# Patient Record
Sex: Male | Born: 1990 | Race: White | Hispanic: No | Marital: Married | Smoking: Former smoker
Health system: Southern US, Community
[De-identification: ages and names within clinical notes are randomized; demographics above are authoritative.]

## PROBLEM LIST (undated history)

## (undated) DIAGNOSIS — U071 COVID-19: Secondary | ICD-10-CM

## (undated) HISTORY — PX: NO PAST SURGERIES: SHX2092

## (undated) HISTORY — DX: COVID-19: U07.1

---
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
## 2015-10-17 ENCOUNTER — Other Ambulatory Visit: Payer: Self-pay | Admitting: Family Medicine

## 2015-10-18 LAB — CMP12+LP+TP+TSH+6AC+CBC/D/PLT
A/G RATIO: 1.7 (ref 1.1–2.5)
ALT: 99 IU/L — ABNORMAL HIGH (ref 0–44)
AST: 71 IU/L — ABNORMAL HIGH (ref 0–40)
Albumin: 4.3 g/dL (ref 3.5–5.5)
Alkaline Phosphatase: 104 IU/L (ref 39–117)
BUN / CREAT RATIO: 15 (ref 8–19)
BUN: 17 mg/dL (ref 6–20)
Basophils Absolute: 0 10*3/uL (ref 0.0–0.2)
Basos: 0 %
Bilirubin Total: 0.4 mg/dL (ref 0.0–1.2)
CHLORIDE: 104 mmol/L (ref 96–106)
CHOL/HDL RATIO: 7.1 ratio — AB (ref 0.0–5.0)
CREATININE: 1.1 mg/dL (ref 0.76–1.27)
Calcium: 9.4 mg/dL (ref 8.7–10.2)
Cholesterol, Total: 241 mg/dL — ABNORMAL HIGH (ref 100–199)
EOS (ABSOLUTE): 0.1 10*3/uL (ref 0.0–0.4)
EOS: 2 %
Estimated CHD Risk: 1.5 times avg. — ABNORMAL HIGH (ref 0.0–1.0)
Free Thyroxine Index: 1.8 (ref 1.2–4.9)
GFR, EST AFRICAN AMERICAN: 107 mL/min/{1.73_m2} (ref >59)
GFR, EST NON AFRICAN AMERICAN: 93 mL/min/{1.73_m2} (ref >59)
GGT: 77 IU/L — AB (ref 0–65)
GLUCOSE: 92 mg/dL (ref 65–99)
Globulin, Total: 2.5 g/dL (ref 1.5–4.5)
HDL: 34 mg/dL — AB (ref >39)
HEMOGLOBIN: 15.5 g/dL (ref 12.6–17.7)
Hematocrit: 45.4 % (ref 37.5–51.0)
Immature Grans (Abs): 0 10*3/uL (ref 0.0–0.1)
Immature Granulocytes: 1 %
Iron: 118 ug/dL (ref 38–169)
LDH: 237 IU/L — AB (ref 121–224)
LDL Calculated: 150 mg/dL — ABNORMAL HIGH (ref 0–99)
Lymphocytes Absolute: 2.1 10*3/uL (ref 0.7–3.1)
Lymphs: 35 %
MCH: 31.4 pg (ref 26.6–33.0)
MCHC: 34.1 g/dL (ref 31.5–35.7)
MCV: 92 fL (ref 79–97)
MONOCYTES: 8 %
Monocytes Absolute: 0.5 10*3/uL (ref 0.1–0.9)
NEUTROS ABS: 3.2 10*3/uL (ref 1.4–7.0)
Neutrophils: 54 %
PHOSPHORUS: 3.1 mg/dL (ref 2.5–4.5)
POTASSIUM: 4.1 mmol/L (ref 3.5–5.2)
Platelets: 260 10*3/uL (ref 150–379)
RBC: 4.94 x10E6/uL (ref 4.14–5.80)
RDW: 13.7 % (ref 12.3–15.4)
Sodium: 144 mmol/L (ref 134–144)
T3 Uptake Ratio: 32 % (ref 24–39)
T4, Total: 5.7 ug/dL (ref 4.5–12.0)
TSH: 1.8 u[IU]/mL (ref 0.450–4.500)
Total Protein: 6.8 g/dL (ref 6.0–8.5)
Triglycerides: 284 mg/dL — ABNORMAL HIGH (ref 0–149)
URIC ACID: 7 mg/dL (ref 3.7–8.6)
VLDL CHOLESTEROL CAL: 57 mg/dL — AB (ref 5–40)
WBC: 5.9 10*3/uL (ref 3.4–10.8)

## 2015-10-18 LAB — HEPATITIS B SURFACE ANTIBODY,QUALITATIVE: HEP B SURFACE AB, QUAL: REACTIVE

## 2016-09-19 ENCOUNTER — Emergency Department
Admission: EM | Admit: 2016-09-19 | Discharge: 2016-09-20 | Disposition: A | Discharge status: Home / Self Care | Payer: Worker's Compensation | Attending: Student in an Organized Health Care Education/Training Program | Admitting: Student in an Organized Health Care Education/Training Program

## 2016-09-19 ENCOUNTER — Encounter: Payer: Self-pay | Admitting: Emergency Medicine

## 2016-09-19 DIAGNOSIS — S81812A Laceration without foreign body, left lower leg, initial encounter: Secondary | ICD-10-CM

## 2016-09-19 DIAGNOSIS — W268XXA Contact with other sharp object(s), not elsewhere classified, initial encounter: Secondary | ICD-10-CM | POA: Insufficient documentation

## 2016-09-19 DIAGNOSIS — S81822A Laceration with foreign body, left lower leg, initial encounter: Secondary | ICD-10-CM | POA: Diagnosis not present

## 2016-09-19 DIAGNOSIS — Y939 Activity, unspecified: Secondary | ICD-10-CM | POA: Insufficient documentation

## 2016-09-19 DIAGNOSIS — Y929 Unspecified place or not applicable: Secondary | ICD-10-CM | POA: Diagnosis not present

## 2016-09-19 DIAGNOSIS — Y999 Unspecified external cause status: Secondary | ICD-10-CM | POA: Insufficient documentation

## 2016-09-19 DIAGNOSIS — Z23 Encounter for immunization: Secondary | ICD-10-CM | POA: Insufficient documentation

## 2016-09-19 DIAGNOSIS — S8992XA Unspecified injury of left lower leg, initial encounter: Secondary | ICD-10-CM | POA: Diagnosis present

## 2016-09-19 NOTE — ED Notes (Signed)
approx 5" lac noted to left knee with no active bleeding; pt becomes pale & diaphoretic; pt placed on stretcher and taken to room 12; charge nurse notified
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
## 2016-09-20 ENCOUNTER — Emergency Department: Payer: Worker's Compensation

## 2016-09-20 MED ORDER — CEPHALEXIN 500 MG PO CAPS
500.0000 mg | ORAL_CAPSULE | Freq: Once | ORAL | Status: AC
  Administered 2016-09-20: 500 mg via ORAL
  Filled 2016-09-20: qty 1

## 2016-09-20 MED ORDER — BACITRACIN ZINC 500 UNIT/GM EX OINT
TOPICAL_OINTMENT | Freq: Once | CUTANEOUS | Status: AC
  Administered 2016-09-20: 1 via TOPICAL
  Filled 2016-09-20 (×2): qty 0.9

## 2016-09-20 MED ORDER — TRAMADOL HCL 50 MG PO TABS: 50.0000 mg | ORAL_TABLET | Freq: Four times a day (QID) | ORAL | 0 refills | Status: AC | PRN

## 2016-09-20 MED ORDER — BUPIVACAINE HCL (PF) 0.25 % IJ SOLN
INTRAMUSCULAR | Status: AC
  Filled 2016-09-20: qty 30

## 2016-09-20 MED ORDER — LIDOCAINE-EPINEPHRINE-TETRACAINE (LET) SOLUTION
3.0000 mL | Freq: Once | NASAL | Status: AC
  Administered 2016-09-20: 3 mL via TOPICAL
  Filled 2016-09-20: qty 3

## 2016-09-20 MED ORDER — BUPIVACAINE HCL 0.25 % IJ SOLN: 30.0000 mL | Freq: Once | INTRAMUSCULAR | Status: DC

## 2016-09-20 MED ORDER — CEPHALEXIN 500 MG PO CAPS: 500.0000 mg | ORAL_CAPSULE | Freq: Three times a day (TID) | ORAL | 0 refills | Status: DC

## 2016-09-20 MED ORDER — BUPIVACAINE HCL (PF) 0.25 % IJ SOLN
30.0000 mL | Freq: Once | INTRAMUSCULAR | Status: AC
  Administered 2016-09-20: 30 mL

## 2016-09-20 MED ORDER — TETANUS-DIPHTH-ACELL PERTUSSIS 5-2.5-18.5 LF-MCG/0.5 IM SUSP
0.5000 mL | Freq: Once | INTRAMUSCULAR | Status: AC
  Administered 2016-09-20: 0.5 mL via INTRAMUSCULAR
  Filled 2016-09-20: qty 0.5

## 2016-09-20 NOTE — Discharge Instructions (Signed)
Tylenol or Motrin as needed for any discomfort. Take any/all prescribed medications as directed. Keep the cut clean & dry for the next 24 hours. After that you may wash it gently once a day with warm, soapy water only. NO peroxide or alcohol!! You may apply some antibiotic ointment & a Band-Aid afterwards.
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
## 2017-03-28 ENCOUNTER — Encounter: Payer: Self-pay | Admitting: Physician Assistant

## 2017-03-28 ENCOUNTER — Ambulatory Visit: Payer: Self-pay | Admitting: Physician Assistant

## 2017-03-28 VITALS — BP 120/80 | HR 76 | Temp 98.3°F

## 2017-03-28 DIAGNOSIS — Z0000 Encounter for general adult medical examination without abnormal findings: Secondary | ICD-10-CM

## 2017-03-28 NOTE — Progress Notes (Signed)
S: pt here for wellness physical had biometrics for insurance purposes done at work, no complaints ros neg. PMH: neg   Social: former smoker quit 2 months ago, drinks about 6 beers a week  Fam:neg  O: vitals wnl, nad, ENT wnl, neck supple no lymph, lungs c t a, cv rrr, abd soft nontender bs normal all 4 quads  A: wellness physical  P: f/u prn

## 2019-05-04 ENCOUNTER — Encounter: Payer: Self-pay | Admitting: Adult Health

## 2019-05-04 ENCOUNTER — Other Ambulatory Visit: Payer: Self-pay

## 2019-05-04 ENCOUNTER — Ambulatory Visit: Payer: Managed Care, Other (non HMO) | Admitting: Adult Health

## 2019-05-04 DIAGNOSIS — Z20828 Contact with and (suspected) exposure to other viral communicable diseases: Secondary | ICD-10-CM

## 2019-05-04 DIAGNOSIS — Z7189 Other specified counseling: Secondary | ICD-10-CM | POA: Diagnosis not present

## 2019-05-04 DIAGNOSIS — Z20822 Contact with and (suspected) exposure to covid-19: Secondary | ICD-10-CM

## 2019-05-04 NOTE — Progress Notes (Signed)
Virtual Visit via Telephone Note  I connected with Nicholas Barber on 05/04/19 at  3:30 PM EDT by telephone and verified that I am speaking with the correct person using two identifiers.  Location: Patient: at home   [facility], [place], [place]     I discussed the limitations, risks, security and privacy concerns of performing an evaluation and management service by telephone and the availability of in person appointments. I also discussed with the patient that there may be a patient responsible charge related to this service. The patient expressed understanding and agreed to proceed.   History of Present Illness: Patient is a 28 year old male in no acute distress who calls the clinic for a telephone visit for Covid exposure at work on July 1st Wednesday.   He does now have cough, nasal congestion, sore throat. Temp on 05/03/19 100.2 that has resolved. Decreased smell. Decreased taste.   Symptoms onset  05/03/19. Patient  denies any chills, rash, chest pain, shortness of breath, nausea, vomiting, or diarrhea.   Denies any one sick at home. He denies being immunocompromised.     Light Smoker Types: Cigarettes Packs/day: 0 Years: 2 Quit Date: 01/26/2017 Question: Are you ready to quit smoking? Response: Yes Comments:      Observations/Objective: afebrile. No other vitals.    Patient is alert and oriented and responsive to questions Engages in conversation with provider. Speaks in full sentences without any pauses without any shortness of breath or distress.    Assessment and Plan: 1. Close Exposure to Covid-19 Virus   2. Educated About Covid-19 Virus Infection      Follow Up Instructions:    I discussed the assessment and treatment plan with the patient. The patient was provided an opportunity to ask questions and all were answered. The patient agreed with the plan and demonstrated an understanding of the instructions.   The  patient was advised to call back or seek an in-person evaluation if the symptoms worsen or if the condition fails to improve as anticipated.  I provided 15 minutes of non-face-to-face time during this encounter.   Marcille Buffy, FNP

## 2019-05-05 ENCOUNTER — Encounter: Payer: Self-pay | Admitting: Adult Health

## 2019-05-05 ENCOUNTER — Telehealth: Payer: Self-pay | Admitting: *Deleted

## 2019-05-05 DIAGNOSIS — Z20822 Contact with and (suspected) exposure to covid-19: Secondary | ICD-10-CM

## 2019-05-05 NOTE — Telephone Encounter (Signed)
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 Needs 2 negative covid tests 24 hours apart to return to work. Appt scheduled and Instructions given. Orders placed

## 2019-05-05 NOTE — Telephone Encounter (Signed)
-----   Message from Doreen Beam, [place] sent at 05/05/2019  3:35 PM EDT ----- Needs testing on 7/7 or 7/8 and then repeat test 24 hours later  covid exposure.

## 2019-05-06 ENCOUNTER — Telehealth: Payer: Self-pay | Admitting: Adult Health

## 2019-05-06 ENCOUNTER — Other Ambulatory Visit: Payer: Self-pay

## 2019-05-06 ENCOUNTER — Other Ambulatory Visit: Payer: Self-pay | Admitting: Adult Health

## 2019-05-06 DIAGNOSIS — Z20822 Contact with and (suspected) exposure to covid-19: Secondary | ICD-10-CM

## 2019-05-06 MED ORDER — BENZONATATE 200 MG PO CAPS: 200.0000 mg | ORAL_CAPSULE | Freq: Three times a day (TID) | ORAL | 0 refills | Status: DC | PRN

## 2019-05-06 MED ORDER — CLARITHROMYCIN 500 MG PO TABS: 500.0000 mg | ORAL_TABLET | Freq: Two times a day (BID) | ORAL | 0 refills | Status: DC

## 2019-05-06 NOTE — Telephone Encounter (Addendum)
Patient called after worker compensation nurse sent temperature of  101.1 this morning.  He reports he took Dayquil once yesterday and once  today and temperature at this moment is 98.3. Chest congestion now developed mostly clear/with yellow tinge occasionally.Denies pain with breathing.  Feels slightly  better today than yesterday.  No pain with breathing.  Moderate cough. No shortness of breath. No gastrointestinal symptoms. No Rash.   Patient  denies any chills, rash, chest pain, shortness of breath, nausea, vomiting, or diarrhea.   Patient is alert and oriented and responsive to questions Engages in conversation with provider. Speaks in full sentences without any pauses without any shortness of breath or distress.   No Known Allergies   Meds ordered this encounter  Medications  . clarithromycin (BIAXIN) 500 MG tablet    Sig: Take 1 tablet (500 mg total) by mouth 2 (two) times daily.    Dispense:  20 tablet    Refill:  0  . benzonatate (TESSALON) 200 MG capsule    Sig: Take 1 capsule (200 mg total) by mouth 3 (three) times daily as needed for cough (will cause drowsiness).    Dispense:  20 capsule    Refill:  0   Discussed medications as above and use, RED Flags discussed with patient and when to seek emergency care/ Call 911, immediately.

## 2019-05-07 ENCOUNTER — Other Ambulatory Visit: Payer: Self-pay

## 2019-05-08 ENCOUNTER — Emergency Department
Admission: EM | Admit: 2019-05-08 | Discharge: 2019-05-08 | Disposition: A | Discharge status: Home / Self Care | Payer: Managed Care, Other (non HMO) | Attending: Emergency Medicine | Admitting: Emergency Medicine

## 2019-05-08 ENCOUNTER — Emergency Department: Payer: Managed Care, Other (non HMO)

## 2019-05-08 ENCOUNTER — Encounter: Payer: Self-pay | Admitting: Emergency Medicine

## 2019-05-08 ENCOUNTER — Telehealth: Payer: Self-pay | Admitting: Adult Health

## 2019-05-08 ENCOUNTER — Other Ambulatory Visit: Payer: Self-pay

## 2019-05-08 DIAGNOSIS — Z79899 Other long term (current) drug therapy: Secondary | ICD-10-CM | POA: Insufficient documentation

## 2019-05-08 DIAGNOSIS — U071 COVID-19: Secondary | ICD-10-CM | POA: Insufficient documentation

## 2019-05-08 DIAGNOSIS — Z87891 Personal history of nicotine dependence: Secondary | ICD-10-CM | POA: Diagnosis not present

## 2019-05-08 DIAGNOSIS — R05 Cough: Secondary | ICD-10-CM | POA: Diagnosis present

## 2019-05-08 LAB — SARS CORONAVIRUS 2 BY RT PCR (HOSPITAL ORDER, PERFORMED IN ~~LOC~~ HOSPITAL LAB): SARS Coronavirus 2: POSITIVE — AB

## 2019-05-08 MED ORDER — IBUPROFEN 600 MG PO TABS: 600.0000 mg | ORAL_TABLET | Freq: Three times a day (TID) | ORAL | 0 refills | Status: DC | PRN

## 2019-05-08 MED ORDER — HYDROCOD POLST-CPM POLST ER 10-8 MG/5ML PO SUER: 5.0000 mL | Freq: Two times a day (BID) | ORAL | 0 refills | Status: DC

## 2019-05-08 NOTE — ED Notes (Signed)
See triage note  States he has had 2 COVID test done but was sent in by nurse at work  States he is having a cough with some wheezing  Low grade fever on arrival

## 2019-05-08 NOTE — Telephone Encounter (Signed)
Patient called he is having increasing shortness of breath and hearing " crackling sounds in his lungs when he breaths".  He reports his cough is worsening since yesterday, " it hurts to take a deep breath " and " I feel like I can not catch my breath at times". He is still febrile 100.8  He has been on Clarithromycin and Benzonatate since 05/06/19, Denies any improvement in cough with cough medication as above.  He is able to speak in full sentences but does have harsh loud coughing while on the phone multiple time.  Advised emergency room now for stat WBC and Chest x ray further work up that is not available in this clinic. 911 if any symptoms worsen. Not to self drive.   Follow up as needed after evaluation at emergency room and Covid test is still pending was performed on 05/05/19 at drive through testing.   Patient verbalized understanding of all instructions given and denies any further questions at this time.

## 2019-05-08 NOTE — ED Provider Notes (Signed)
[facility] Emergency Department Provider Note   ____________________________________________   First MD Initiated Contact with Patient 05/08/19 1159     (approximate)  I have reviewed the triage vital signs and the nursing notes.   HISTORY  Chief Complaint Cough (+ COVID exposure)    HPI Nicholas Barber is a 28 y.o. male patient presents with cough for 2 to 3 days.  Patient also has intermittent fever, body aches, loss of taste and smell.  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  Patient state max temperature at home was 103 1 hour prior to arrival.  Patient took ibuprofen prior to arrival.         History reviewed. No pertinent past medical history.  There are no active problems to display for this patient.   History reviewed. No pertinent surgical history.  Prior to Admission medications   Medication Sig Start Date End Date Taking? Authorizing Provider  benzonatate (TESSALON) 200 MG capsule Take 1 capsule (200 mg total) by mouth 3 (three) times daily as needed for cough (will cause drowsiness). 05/06/19   Flinchum, Eula FriedMichelle S, FNP  chlorpheniramine-HYDROcodone (TUSSIONEX PENNKINETIC ER) 10-8 MG/5ML SUER Take 5 mLs by mouth 2 (two) times daily. 05/08/19   Joni ReiningSmith, Charlita Brian K, PA-C  clarithromycin (BIAXIN) 500 MG tablet Take 1 tablet (500 mg total) by mouth 2 (two) times daily. 05/06/19   Flinchum, Eula FriedMichelle S, FNP  ibuprofen (ADVIL) 600 MG tablet Take 1 tablet (600 mg total) by mouth every 8 (eight) hours as needed. 05/08/19   Joni ReiningSmith, Lenus Trauger K, PA-C    Allergies Patient has no known allergies.  Family History  Problem Relation Age of Onset  . Early death Neg Hx   . Heart disease Neg Hx   . Cancer Neg Hx     Social History Social History   Tobacco Use  . Smoking status: Former Smoker    Quit date: 01/26/2017    Years since quitting: 2.2  . Smokeless tobacco: Never Used  Substance Use Topics   . Alcohol use: Yes    Alcohol/week: 6.0 standard drinks    Types: 6 Cans of beer per week  . Drug use: No    Review of Systems Constitutional: No fever/chills.  Lays Eyes: No visual changes. ENT: No sore throat. Cardiovascular: Denies chest pain. Respiratory: Denies shortness of breath.  Dyspnea secondary to nonproductive cough. Gastrointestinal: No abdominal pain.  No nausea, no vomiting.  No diarrhea.  No constipation. Genitourinary: Negative for dysuria. Musculoskeletal: Negative for back pain. Skin: Negative for rash. Neurological: Positive for headaches, but denies focal weakness or numbness.  ____________________________________________   PHYSICAL EXAM:  VITAL SIGNS: ED Triage Vitals  Enc Vitals Group     BP 05/08/19 1116 115/69     Pulse Rate 05/08/19 1116 96     Resp 05/08/19 1116 18     Temp 05/08/19 1116 99.7 F (37.6 C)     Temp Source 05/08/19 1116 Oral     SpO2 05/08/19 1116 95 %     Weight 05/08/19 1119 170 lb (77.1 kg)     Height 05/08/19 1119 5\' 6"  (1.676 m)     Head Circumference --      Peak Flow --      Pain Score 05/08/19 1122 0     Pain Loc --      Pain Edu? --      Excl. in GC? --    Constitutional: Alert and  oriented. Well appearing and in no acute distress. Hematological/Lymphatic/Immunilogical: No cervical lymphadenopathy. Cardiovascular: Normal rate, regular rhythm. Grossly normal heart sounds.  Good peripheral circulation. Respiratory: Normal respiratory effort.  No retractions. Lungs CTAB. Gastrointestinal: Soft and nontender. No distention. No abdominal bruits. No CVA tenderness. Genitourinary: Deferred Musculoskeletal: No lower extremity tenderness nor edema.  No joint effusions. Neurologic:  Normal speech and language. No gross focal neurologic deficits are appreciated. No gait instability. Skin:  Skin is warm, dry and intact. No rash noted. Psychiatric: Mood and affect are normal. Speech and behavior are normal.   ____________________________________________   LABS (all labs ordered are listed, but only abnormal results are displayed)  Labs Reviewed  SARS CORONAVIRUS 2 (HOSPITAL ORDER, PERFORMED IN [facility]) - Abnormal; Notable for the following components:      Result Value   SARS Coronavirus 2 POSITIVE (*)    All other components within normal limits   ____________________________________________  EKG   ____________________________________________  RADIOLOGY  ED MD interpretation:   Official radiology report(s): Dg Chest Portable 1 View  Result Date: 05/08/2019 CLINICAL DATA:  Cough, fever. EXAM: PORTABLE CHEST 1 VIEW COMPARISON:  None. FINDINGS: The heart size and mediastinal contours are within normal limits. No pneumothorax or pleural effusion is noted. Minimal to mild bibasilar opacities are noted which may represent atelectasis or possibly infiltrates. The visualized skeletal structures are unremarkable. IMPRESSION: Minimal to mild bibasilar opacities are noted which may represent atelectasis or possibly infiltrates. Electronically Signed   By: Lupita RaiderJames  Green Jr M.D.   On: 05/08/2019 13:19    ____________________________________________   PROCEDURES  Procedure(s) performed (including Critical Care):  Procedures   ____________________________________________   INITIAL IMPRESSION / ASSESSMENT AND PLAN / ED COURSE  As part of my medical decision making, I reviewed the following data within the electronic MEDICAL RECORD NUMBER     Nicholas Barber was evaluated in Emergency Department on 05/08/2019 for the symptoms described in the history of present illness. He was evaluated in the context of the global COVID-19 pandemic, which necessitated consideration that the patient might be at risk for infection with the SARS-CoV-2 virus that causes COVID-19. Institutional protocols and algorithms that pertain to the evaluation of patients at risk for COVID-19 are in a state of rapid  change based on information released by regulatory bodies including the CDC and federal and state organizations. These policies and algorithms were followed during the patient's care in the ED.     Patient presents the ED with 2 to 3 days of coughing.  Patient has been sick for 1 week.  Patient also complained of body aches/chills nonproductive cough.  Patient is currently taking Biaxin for suspected pneumonia prescribed by his PCP.  Patient COVID-19 test was positive today.  Patient given discharge care instruction and will isolate per CDC recommendations.  Patient will follow-up with PCP.      ____________________________________________   FINAL CLINICAL IMPRESSION(S) / ED DIAGNOSES  Final diagnoses:  COVID-19 virus detected     ED Discharge Orders         Ordered    chlorpheniramine-HYDROcodone (TUSSIONEX PENNKINETIC ER) 10-8 MG/5ML SUER  2 times daily     05/08/19 1442    ibuprofen (ADVIL) 600 MG tablet  Every 8 hours PRN     05/08/19 1442           Note:  This document was prepared using Dragon voice recognition software and may include unintentional dictation errors.    Joni ReiningSmith, Ascencion Stegner K, PA-C  05/08/19 1446    Lavonia Drafts, MD 05/08/19 1451

## 2019-05-08 NOTE — ED Triage Notes (Signed)
Pt presents to ED in NAD c/o cough x2-3 days with fever and loss of teste and smell. Pt states he works for [employer] and has had exposure to COVID+ people. Reports max temp at home 103, has been taking ibuprofen/tylenol at home.

## 2019-05-10 DIAGNOSIS — U071 COVID-19: Secondary | ICD-10-CM | POA: Insufficient documentation

## 2019-05-10 DIAGNOSIS — J1282 Pneumonia due to coronavirus disease 2019: Secondary | ICD-10-CM | POA: Insufficient documentation

## 2019-05-10 LAB — NOVEL CORONAVIRUS, NAA: SARS-CoV-2, NAA: DETECTED — AB

## 2019-05-11 LAB — NOVEL CORONAVIRUS, NAA: SARS-CoV-2, NAA: DETECTED — AB

## 2019-05-11 MED ORDER — ENOXAPARIN SODIUM 30 MG/0.3ML ~~LOC~~ SOLN
30.00 | SUBCUTANEOUS | Status: DC
Start: 2019-05-13 — End: 2019-05-11

## 2019-05-11 MED ORDER — ONDANSETRON 4 MG PO TBDP
4.00 | ORAL_TABLET | ORAL | Status: DC
Start: ? — End: 2019-05-11

## 2019-05-11 MED ORDER — DEXAMETHASONE 4 MG PO TABS
6.00 | ORAL_TABLET | ORAL | Status: DC
Start: 2019-05-14 — End: 2019-05-11

## 2019-05-11 MED ORDER — FAMOTIDINE 20 MG PO TABS
20.00 | ORAL_TABLET | ORAL | Status: DC
Start: ? — End: 2019-05-11

## 2019-05-11 MED ORDER — MENTHOL 9.1 MG MT LOZG
1.00 | LOZENGE | OROMUCOSAL | Status: DC
Start: ? — End: 2019-05-11

## 2019-05-11 MED ORDER — GENERIC EXTERNAL MEDICATION
Status: DC
Start: ? — End: 2019-05-11

## 2019-05-11 MED ORDER — ACETAMINOPHEN 325 MG PO TABS
650.00 | ORAL_TABLET | ORAL | Status: DC
Start: ? — End: 2019-05-11

## 2019-05-13 LAB — HM HIV SCREENING LAB: HM HIV Screening: NEGATIVE

## 2019-05-13 LAB — HM HEPATITIS C SCREENING LAB: HM Hepatitis Screen: NEGATIVE

## 2019-05-13 MED ORDER — GENERIC EXTERNAL MEDICATION
100.00 | Status: DC
Start: 2019-05-14 — End: 2019-05-13

## 2019-05-13 MED ORDER — GUAIFENESIN-DM 100-10 MG/5ML PO SYRP
5.00 | ORAL_SOLUTION | ORAL | Status: DC
Start: ? — End: 2019-05-13

## 2019-05-14 ENCOUNTER — Telehealth: Payer: Self-pay | Admitting: Adult Health

## 2019-05-14 ENCOUNTER — Telehealth: Payer: Self-pay

## 2019-05-14 NOTE — Telephone Encounter (Signed)
Copied from [place]. Topic: General - Other >> May 14, 2019  2:45 PM Keene Breath wrote: Reason for CRM: Patient called to schedule a new patient appt.  Called the office but did not get an answer.  Please call patient to schedule at [phone]

## 2019-05-14 NOTE — Telephone Encounter (Signed)
Called patient 05/14/19 1245 pm to discuss after visit summary from [facility].  He had been sent by this provider to the emergency room on 05/08/19 due to worsening symptoms- he went to [facility] reports he had a chest x ray and no lab work, and was discharged home.  He did test Covid 19 positive in the emergency room. The other test  For Covid 19 had previously ordered was still pending at that time, since returned positive as well.   He reports his fever got higher and his oxygen saturations decreased on 7/18 and 7/19 he was then taken to [facility] and was admitted to the hospital treated with dexamethasone and Robitussin/ Guaifenesin, and continued Clarithromycin.  He finished his antibiotics today.   He reports he is doing well now, still has mild non productive cough, afebrile. No fever reducers.  Patient  denies any fever, body aches,chills, rash, chest pain, shortness of breath, nausea, vomiting, or diarrhea.  He states " I am feeling much better".   Provider advised he will need a repeat chest x ray in 3 to 4 weeks for follow up to be sure all resolved and sooner if any symptoms. Provider told patient that she is listed as his primary care in this office and that this clinic is acute care only. He should find a primary care he was given numbers of offices to call for a primary care appointment. He is aware he can call the office here back if he does not get in with a primary care provider however this is highly advised. He reports he will call  For a primary care.  Discussed RED FLAGS and when to be seen in the emergency room/ versus office visit.  Patient verbalized understanding of all instructions given and denies any further questions at this time.

## 2019-05-18 ENCOUNTER — Ambulatory Visit: Payer: Managed Care, Other (non HMO) | Admitting: Adult Health

## 2019-05-18 ENCOUNTER — Encounter: Payer: Self-pay | Admitting: Adult Health

## 2019-05-18 ENCOUNTER — Other Ambulatory Visit: Payer: Self-pay

## 2019-05-18 DIAGNOSIS — Z7189 Other specified counseling: Secondary | ICD-10-CM | POA: Diagnosis not present

## 2019-05-18 DIAGNOSIS — R059 Cough, unspecified: Secondary | ICD-10-CM

## 2019-05-18 DIAGNOSIS — R05 Cough: Secondary | ICD-10-CM | POA: Diagnosis not present

## 2019-05-18 NOTE — Patient Instructions (Signed)
Sore Throat When you have a sore throat, your throat may feel:  Tender.  Burning.  Irritated.  Scratchy.  Painful when you swallow.  Painful when you talk. Many things can cause a sore throat, such as:  An infection.  Allergies.  Dry air.  Smoke or pollution.  Radiation treatment.  Gastroesophageal reflux disease (GERD).  A tumor. A sore throat can be the first sign of another sickness. It can happen with other problems, like:  Coughing.  Sneezing.  Fever.  Swelling in the neck. Most sore throats go away without treatment. Follow these instructions at home:      Take over-the-counter medicines only as told by your doctor. ? If your child has a sore throat, do not give your child aspirin.  Drink enough fluids to keep your pee (urine) pale yellow.  Rest when you feel you need to.  To help with pain: ? Sip warm liquids, such as broth, herbal tea, or warm water. ? Eat or drink cold or frozen liquids, such as frozen ice pops. ? Gargle with a salt-water mixture 3-4 times a day or as needed. To make a salt-water mixture, add -1 tsp (3-6 g) of salt to 1 cup (237 mL) of warm water. Mix it until you cannot see the salt anymore. ? Suck on hard candy or throat lozenges. ? Put a cool-mist humidifier in your bedroom at night. ? Sit in the bathroom with the door closed for 5-10 minutes while you run hot water in the shower.  Do not use any products that contain nicotine or tobacco, such as cigarettes, e-cigarettes, and chewing tobacco. If you need help quitting, ask your doctor.  Wash your hands well and often with soap and water. If soap and water are not available, use hand sanitizer. Contact a doctor if:  You have a fever for more than 2-3 days.  You keep having symptoms for more than 2-3 days.  Your throat does not get better in 7 days.  You have a fever and your symptoms suddenly get worse.  Your child who is 3 months to 29 years old has a temperature of  102.47F (39C) or higher. Get help right away if:  You have trouble breathing.  You cannot swallow fluids, soft foods, or your saliva.  You have swelling in your throat or neck that gets worse.  You keep feeling sick to your stomach (nauseous).  You keep throwing up (vomiting). Summary  A sore throat is pain, burning, irritation, or scratchiness in the throat. Many things can cause a sore throat.  Take over-the-counter medicines only as told by your doctor. Do not give your child aspirin.  Drink plenty of fluids, and rest as needed.  Contact a doctor if your symptoms get worse or your sore throat does not get better within 7 days. This information is not intended to replace advice given to you by your health care provider. Make sure you discuss any questions you have with your health care provider. Document Released: 07/24/2008 Document Revised: 03/17/2018 Document Reviewed: 03/17/2018 Elsevier Patient Education  2020 Elsevier Inc. Cough, Adult A cough helps to clear your throat and lungs. A cough may be a sign of an illness or another medical condition. An acute cough may only last 2-3 weeks, while a chronic cough may last 8 or more weeks. Many things can cause a cough. They include:  Germs (viruses or bacteria) that attack the airway.  Breathing in things that bother (irritate) your lungs.  Allergies.  Asthma.  Mucus that runs down the back of your throat (postnasal drip).  Smoking.  Acid backing up from the stomach into the tube that moves food from the mouth to the stomach (gastroesophageal reflux).  Some medicines.  Lung problems.  Other medical conditions, such as heart failure or a blood clot in the lung (pulmonary embolism). Follow these instructions at home: Medicines  Take over-the-counter and prescription medicines only as told by your doctor.  Talk with your doctor before you take medicines that stop a cough (coughsuppressants). Lifestyle   Do not  smoke, and try not to be around smoke. Do not use any products that contain nicotine or tobacco, such as cigarettes, e-cigarettes, and chewing tobacco. If you need help quitting, ask your doctor.  Drink enough fluid to keep your pee (urine) pale yellow.  Avoid caffeine.  Do not drink alcohol if your doctor tells you not to drink. General instructions   Watch for any changes in your cough. Tell your doctor about them.  Always cover your mouth when you cough.  Stay away from things that make you cough, such as perfume, candles, campfire smoke, or cleaning products.  If the air is dry, use a cool mist vaporizer or humidifier in your home.  If your cough is worse at night, try using extra pillows to raise your head up higher while you sleep.  Rest as needed.  Keep all follow-up visits as told by your doctor. This is important. Contact a doctor if:  You have new symptoms.  You cough up pus.  Your cough does not get better after 2-3 weeks, or your cough gets worse.  Cough medicine does not help your cough and you are not sleeping well.  You have pain that gets worse or pain that is not helped with medicine.  You have a fever.  You are losing weight and you do not know why.  You have night sweats. Get help right away if:  You cough up blood.  You have trouble breathing.  Your heartbeat is very fast. These symptoms may be an emergency. Do not wait to see if the symptoms will go away. Get medical help right away. Call your local emergency services (911 in the [place] Do not drive yourself to the hospital. Summary  A cough helps to clear your throat and lungs. Many things can cause a cough.  Take over-the-counter and prescription medicines only as told by your doctor.  Always cover your mouth when you cough.  Contact a doctor if you have new symptoms or you have a cough that does not get better or gets worse. This information is not intended to replace advice given to  you by your health care provider. Make sure you discuss any questions you have with your health care provider. Document Released: 06/28/2011 Document Revised: 11/03/2018 Document Reviewed: 11/03/2018 Elsevier Patient Education  2020 Elsevier Inc.  COVID-19: How to Protect Yourself and Others Know how it spreads  There is currently no vaccine to prevent coronavirus disease 2019 (COVID-19).  The best way to prevent illness is to avoid being exposed to this virus.  The virus is thought to spread mainly from person-to-person. ? Between people who are in close contact with one another (within about 6 feet). ? Through respiratory droplets produced when an infected person coughs, sneezes or talks. ? These droplets can land in the mouths or noses of people who are nearby or possibly be inhaled into the lungs. ? Some recent studies have  suggested that COVID-19 may be spread by people who are not showing symptoms. Everyone should Clean your hands often  Wash your hands often with soap and water for at least 20 seconds especially after you have been in a public place, or after blowing your nose, coughing, or sneezing.  If soap and water are not readily available, use a hand sanitizer that contains at least 60% alcohol. Cover all surfaces of your hands and rub them together until they feel dry.  Avoid touching your eyes, nose, and mouth with unwashed hands. Avoid close contact  Stay home if you are sick.  Avoid close contact with people who are sick.  Put distance between yourself and other people. ? Remember that some people without symptoms may be able to spread virus. ? This is especially important for people who are at higher risk of getting very [employer].cy Cover your mouth and nose with a cloth face cover when around others  You could spread COVID-19 to others even if you do not feel sick.  Everyone should  wear a cloth face cover when they have to go out in public, for example to the grocery store or to pick up other necessities. ? Cloth face coverings should not be placed on young children under age 36, anyone who has trouble breathing, or is unconscious, incapacitated or otherwise unable to remove the mask without assistance.  The cloth face cover is meant to protect other people in case you are infected.  Do NOT use a facemask meant for a Dietitian.  Continue to keep about 6 feet between yourself and others. The cloth face cover is not a substitute for social distancing. Cover coughs and sneezes  If you are in a private setting and do not have on your cloth face covering, remember to always cover your mouth and nose with a tissue when you cough or sneeze or use the inside of your elbow.  Throw used tissues in the trash.  Immediately wash your hands with soap and water for at least 20 seconds. If soap and water are not readily available, clean your hands with a hand sanitizer that contains at least 60% alcohol. Clean and disinfect  Clean AND disinfect frequently touched surfaces daily. This includes tables, doorknobs, light switches, countertops, handles, desks, phones, keyboards, toilets, faucets, and sinks. [employer]fr  If surfaces are dirty, clean them: Use detergent or soap and water prior to disinfection.  Then, use a household disinfectant. You can see a list of EPA-registered household disinfectants here. michellinders.com 03/03/2019 This information is not intended to replace advice given to you by your health care provider. Make sure you discuss any questions you have with your health care provider. Document Released: 02/10/2019 Document Revised: 03/11/2019 Document Reviewed: 02/10/2019 Elsevier Patient Education  [place].

## 2019-05-18 NOTE — Progress Notes (Addendum)
Virtual Visit via Telephone Note  I connected with Nicholas Barber on 05/18/19 at 12:00 PM EDT by telephone and verified that I am speaking with the correct person using two identifiers.  Location: Patient: at home  Provider: [facility], [place], [place]     I discussed the limitations, risks, security and privacy concerns of performing an evaluation and management service by telephone and the availability of in person appointments. I also discussed with the patient that there may be a patient responsible charge related to this service. The patient expressed understanding and agreed to proceed.   History of Present Illness: Patient is a 28 year old male in no acute distress, who calls the clinic he reports his fiance has sore throat last night that is now resolved. He reports he has an occasionally cough. Non productive.  No pain with breathing.  Pulse ox above 97 %. Denies any crackling in lungs.  He stopped Clarithromycin at hospital. He has 3 rounds of Remdisivir in the hospital.  Afebrile.  He has prescription Robitussin but has not taken in several days. He reports he is feeling better.  He denies any shortness of breath.   Denies any medication use.   Fiance is Covid positive since 14th.   Patient  denies any fever, body aches,chills, rash, chest pain, shortness of breath, nausea, vomiting, or diarrhea.     Observations/Objective: temperature 98.1 no fever reducers. Pulse oximetry 98 % , No other vitals available.    Patient is alert and oriented and responsive to questions Engages in conversation with provider. Speaks in full sentences without any pauses without any shortness of breath or distress.   Assessment and Plan:    ICD-10-CM   1. Cough- improving   R05   2. Advice Given About Covid-19 Virus by Telephone  Z71.89    He is aware that if he continues to improve he will need a repeat chest x ray in around 1 month approximately August  9th or after. Provider will place standing order for him to go to [facility] outpatient imaging.  He is aware he needs a primary care provider, he has left message with [facility] and is awaiting a call back from them to establish primary care.   I have already ordered this as a future order in the computer.  Orders Placed This Encounter  Procedures  . DG Chest 2 View    Follow Up Instructions: Monitor symptoms, monitor temperature, report any new or worsening  symptoms to clinic. Monitor oxygen saturation. Go to the emergency room if any RED FLAGS as discussed.   Given the severity of symptoms patient had will extend Return to work  To 05/22/19 and advise rest, hydration and symptom management.  I discussed the assessment and treatment plan with the patient. The patient was provided an opportunity to ask questions and all were answered. The patient agreed with the plan and demonstrated an understanding of the instructions.   The patient was advised to call back or seek an in-person evaluation if the symptoms worsen or if the condition fails to improve as anticipated.  Discussed mask wear and hygiene upon returning to work and to wear mask unless eating or drinking.  Discussed below with patient.  Here is the [facility] Communicable Disease response.  Do recovered COVID-19 cases who are re-exposed to the virus need to quarantine?  To date there is no good data to provide definitive recommendations on this issue but based on  preliminary data, we recommend the below (this guidance is subject to change as additional data becomes available):   Marland Kitchen Re-exposures within three months (<12 weeks) of case recovery, do not need to quarantine.   Marland Kitchen Re-exposures greater than three months (?12 weeks) of case recovery, should be quarantined for 14 days. o These individuals should be advised to monitor themselves for symptom onset or worsening (if they never returned to baseline health status)   o We  do not recommend that they get retested for COVID-19, unless clinically indicated and advised to do so by their PMD. This is because individuals can continue to have detectable virus by PCR days to weeks after they have met the end of isolation criteria, but this doesn't necessarily correlate to viable virus (meaning the virus is able to infect others).   I provided 15 minutes of non-face-to-face time during this encounter.   Marcille Buffy, FNP

## 2019-05-29 ENCOUNTER — Ambulatory Visit (INDEPENDENT_AMBULATORY_CARE_PROVIDER_SITE_OTHER): Payer: Managed Care, Other (non HMO) | Admitting: Internal Medicine

## 2019-05-29 ENCOUNTER — Encounter: Payer: Self-pay | Admitting: Internal Medicine

## 2019-05-29 ENCOUNTER — Other Ambulatory Visit: Payer: Self-pay

## 2019-05-29 DIAGNOSIS — Z13818 Encounter for screening for other digestive system disorders: Secondary | ICD-10-CM

## 2019-05-29 DIAGNOSIS — U071 COVID-19: Secondary | ICD-10-CM | POA: Insufficient documentation

## 2019-05-29 DIAGNOSIS — R945 Abnormal results of liver function studies: Secondary | ICD-10-CM

## 2019-05-29 DIAGNOSIS — Z1329 Encounter for screening for other suspected endocrine disorder: Secondary | ICD-10-CM

## 2019-05-29 DIAGNOSIS — R7989 Other specified abnormal findings of blood chemistry: Secondary | ICD-10-CM | POA: Diagnosis not present

## 2019-05-29 DIAGNOSIS — R05 Cough: Secondary | ICD-10-CM | POA: Diagnosis not present

## 2019-05-29 DIAGNOSIS — R7402 Elevation of levels of lactic acid dehydrogenase (LDH): Secondary | ICD-10-CM

## 2019-05-29 DIAGNOSIS — Z1389 Encounter for screening for other disorder: Secondary | ICD-10-CM

## 2019-05-29 DIAGNOSIS — R7982 Elevated C-reactive protein (CRP): Secondary | ICD-10-CM

## 2019-05-29 DIAGNOSIS — Z1159 Encounter for screening for other viral diseases: Secondary | ICD-10-CM

## 2019-05-29 DIAGNOSIS — E559 Vitamin D deficiency, unspecified: Secondary | ICD-10-CM

## 2019-05-29 DIAGNOSIS — J189 Pneumonia, unspecified organism: Secondary | ICD-10-CM

## 2019-05-29 DIAGNOSIS — R059 Cough, unspecified: Secondary | ICD-10-CM

## 2019-05-29 DIAGNOSIS — Z13220 Encounter for screening for lipoid disorders: Secondary | ICD-10-CM

## 2019-05-29 DIAGNOSIS — R740 Nonspecific elevation of levels of transaminase and lactic acid dehydrogenase [LDH]: Secondary | ICD-10-CM

## 2019-05-29 MED ORDER — ALBUTEROL SULFATE HFA 108 (90 BASE) MCG/ACT IN AERS: 1.0000 | INHALATION_SPRAY | RESPIRATORY_TRACT | 2 refills | Status: DC | PRN

## 2019-05-29 NOTE — Progress Notes (Addendum)
Virtual Visit via Video Note  I connected with Nicholas Barber  on 05/29/19 at  9:40 AM EDT by a video enabled telemedicine application and verified that I am speaking with the correct person using two identifiers.  Location patient:car Location provider:work or home office Persons participating in the virtual visit: patient, provider  I discussed the limitations of evaluation and management by telemedicine and the availability of in person appointments. The patient expressed understanding and agreed to proceed.   HPI: COVID + 05/06/2019 and tested multiple times after this 05/13/19 worsening sob and cough [facility] and given 3 days of Remdisivir and was hospitalized x 4 days and doing better other than residual cough CXR 04/2019 + b/l opacites + COVID. Cough is intermittently worse at night unknown trigger nothing tried was given Tussionex and tessalon in 05/06/2019. He was in contact with coworker + [facility]   Overall doing better except for persisant cough  Labs reviewed alt 61, D dimer elevated, CRP elevated LDH elevated IL 10 and IL 6 elevated ferritin and fibrinogen elevated, lfts ALT 61 and inflammatory markers 05/13/19 [facility] CXR b/l opacities consistent for COVID 19    ROS: See pertinent positives and negatives per HPI. General: wt stable  HEENT: no sore throat  CV: no chest pain  Lungs no sob+ cough  GI: no ab pain  MSK: no jt pain Skin on issues  Neuro: no h/a  Psych: no anxiety/depression GU: no issues  Past Medical History:  Diagnosis Date  . COVID-19 virus infection    05/06/19    Past Surgical History:  Procedure Laterality Date  . NO PAST SURGERIES      Family History  Problem Relation Age of Onset  . Cancer Maternal Grandfather        skin   . Macular degeneration Maternal Grandfather   . Early death Neg Hx   . Heart disease Neg Hx     SOCIAL HX: works [employer] office    Current Outpatient Medications:  .  albuterol  (VENTOLIN HFA) 108 (90 Base) MCG/ACT inhaler, Inhale 1-2 puffs into the lungs every 4 (four) hours as needed for wheezing or shortness of breath., Disp: 18 g, Rfl: 2  EXAM:  VITALS per patient if applicable:  GENERAL: alert, oriented, appears well and in no acute distress  HEENT: atraumatic, conjunttiva clear, no obvious abnormalities on inspection of external nose and ears  NECK: normal movements of the head and neck  LUNGS: on inspection no signs of respiratory distress, breathing rate appears normal, no obvious gross SOB, gasping or wheezing  CV: no obvious cyanosis  MS: moves all visible extremities without noticeable abnormality  PSYCH/NEURO: pleasant and cooperative, no obvious depression or anxiety, speech and thought processing grossly intact  ASSESSMENT AND PLAN:  Discussed the following assessment and plan:  COVID-19 virus infection - Plan: albuterol (VENTOLIN HFA) 108 (90 Base) MCG/ACT inhaler, CT ANGIO CHEST PE W OR WO CONTRAST  Cough - Plan: albuterol (VENTOLIN HFA) 108 (90 Base) MCG/ACT inhaler, CT ANGIO CHEST PE W OR WO CONTRAST  Pneumonia of both lungs due to infectious organism, unspecified part of lung - Plan: albuterol (VENTOLIN HFA) 108 (90 Base) MCG/ACT inhaler, CT ANGIO CHEST PE W OR WO CONTRAST  Positive D dimer - Plan: CT ANGIO CHEST PE W OR WO CONTRAST  HM sch fasting labs at [facility]  Flu disc at f/u  utd Tdap  HIV and hep C negative  I discussed the assessment and treatment plan with the patient. The patient was provided an opportunity to ask questions and all were answered. The patient agreed with the plan and demonstrated an understanding of the instructions.   The patient was advised to call back or seek an in-person evaluation if the symptoms worsen or if the condition fails to improve as anticipated.  Time spent 30 minutes  Delorise Jackson, MD

## 2019-06-09 ENCOUNTER — Other Ambulatory Visit: Payer: Self-pay | Admitting: Internal Medicine

## 2019-06-09 ENCOUNTER — Telehealth: Payer: Self-pay | Admitting: Internal Medicine

## 2019-06-09 DIAGNOSIS — U071 COVID-19: Secondary | ICD-10-CM

## 2019-06-09 DIAGNOSIS — R05 Cough: Secondary | ICD-10-CM

## 2019-06-09 DIAGNOSIS — R059 Cough, unspecified: Secondary | ICD-10-CM

## 2019-06-09 NOTE — Telephone Encounter (Signed)
Please inform pt Insurance will not cover CT chest advise pt  For now walk into [facility] outpatient [facility] [place] and get CXR  If CXR abnormal will order CT chest   Thanks [place]

## 2019-06-10 ENCOUNTER — Ambulatory Visit
Admission: RE | Admit: 2019-06-10 | Discharge: 2019-06-10 | Disposition: A | Discharge status: Home / Self Care | Payer: Managed Care, Other (non HMO) | Source: Ambulatory Visit | Attending: Internal Medicine | Admitting: Internal Medicine

## 2019-06-10 ENCOUNTER — Other Ambulatory Visit: Payer: Self-pay

## 2019-06-10 DIAGNOSIS — U071 COVID-19: Secondary | ICD-10-CM

## 2019-06-10 DIAGNOSIS — R05 Cough: Secondary | ICD-10-CM | POA: Insufficient documentation

## 2019-06-10 DIAGNOSIS — R7989 Other specified abnormal findings of blood chemistry: Secondary | ICD-10-CM | POA: Diagnosis present

## 2019-06-10 DIAGNOSIS — J189 Pneumonia, unspecified organism: Secondary | ICD-10-CM | POA: Diagnosis present

## 2019-06-10 DIAGNOSIS — R059 Cough, unspecified: Secondary | ICD-10-CM

## 2019-06-10 MED ORDER — IOHEXOL 350 MG/ML SOLN
75.0000 mL | Freq: Once | INTRAVENOUS | Status: AC | PRN
  Administered 2019-06-10: 75 mL via INTRAVENOUS

## 2019-06-10 NOTE — Telephone Encounter (Signed)
His insurance did authorize his CT Angio. He is scheduled today @ 1. [place]

## 2019-06-10 NOTE — Telephone Encounter (Signed)
Ok cancel CXR then  Did not know this   thanks [place]

## 2019-06-15 ENCOUNTER — Ambulatory Visit: Payer: Managed Care, Other (non HMO) | Admitting: Adult Health

## 2019-06-15 ENCOUNTER — Encounter: Payer: Self-pay | Admitting: Adult Health

## 2019-06-15 ENCOUNTER — Other Ambulatory Visit: Payer: Self-pay

## 2019-06-15 VITALS — BP 120/76 | HR 93 | Temp 97.8°F | Resp 16 | Ht 66.0 in | Wt 184.0 lb

## 2019-06-15 DIAGNOSIS — J181 Lobar pneumonia, unspecified organism: Secondary | ICD-10-CM | POA: Diagnosis not present

## 2019-06-15 DIAGNOSIS — J189 Pneumonia, unspecified organism: Secondary | ICD-10-CM

## 2019-06-15 DIAGNOSIS — Z0189 Encounter for other specified special examinations: Secondary | ICD-10-CM | POA: Diagnosis not present

## 2019-06-15 DIAGNOSIS — Z008 Encounter for other general examination: Secondary | ICD-10-CM | POA: Diagnosis not present

## 2019-06-15 DIAGNOSIS — R0989 Other specified symptoms and signs involving the circulatory and respiratory systems: Secondary | ICD-10-CM

## 2019-06-15 MED ORDER — GUAIFENESIN ER 600 MG PO TB12: 600.0000 mg | ORAL_TABLET | Freq: Two times a day (BID) | ORAL | 0 refills | Status: DC

## 2019-06-15 MED ORDER — LEVOFLOXACIN 500 MG PO TABS: 500.0000 mg | ORAL_TABLET | Freq: Every day | ORAL | 0 refills | Status: DC

## 2019-06-15 NOTE — Progress Notes (Signed)
[facility]  Nicholas Barber DOB: 28 y.o. MRN: [id number]  Subjective:  Here for Biometric Screen/brief exam  Patient is a 28 year old male in no acute distress he is here for his biometric brief exam as well as biometric screening for his employers insurance at [employer]. He works with the [employer].  He reports he is doing well, he is recovering from Covid 19. He has since established primary care with McLean-Scocuzza, Nino Glow, MD as advised.   He reports he is doing much better than when he was hospitalized. He continues to use Albuterol inhaler as needed - no more than three times daily for mild shortness of breath. He had a recent Chest CT Scan  follow up per his PCP. He reports that he continues to have a productive cough with thick white/ yellow tinged sputum in copious amounts at time. Denies fever. Mild fatigue but improved from onset of Covid diagnosis 05/08/19 and hospitalization at [facility] on 05/10/19 -05/10/19 he received Remdisivir. He has had recheck of abnormal labs with his primary care since this time.  Patient  denies any fever, body aches,chills, rash,   nausea, vomiting, or diarrhea. Denies dizziness or lightheadedness.   CT/ PCP notes as below Notes recorded by McLean-Scocuzza, Nino Glow, MD on 06/10/2019 at 3:35 PM EDT  Fluid collection 3.7 x 3.5 x 2.0 cm near the arteries of the heart and around the heart and or possibly a cyst which is benign   Right lower lobe possibly left over changes of covid 19  IMPRESSION:06/10/19 1. No demonstrable pulmonary embolus. No thoracic aortic aneurysm or dissection.  2. 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 This structure appears benign.  3. Areas of mild atelectatic  change. A slight degree of residual viral pneumonitis may be present in the right lower lobe. Note that patient reports recently having coronavirus. There is no airspace consolidation. No pleural effusion.  4.  No evident adenopathy.   Electronically Signed   By: Lowella Grip III M.D.   On: 06/10/2019 13:51            Objective: Blood pressure 120/76, pulse 93, temperature 97.8 F (36.6 C), temperature source Temporal, resp. rate 16, height 5\' 6"  (1.676 m), weight 184 lb (83.5 kg), SpO2 98 %. NAD, well developed, well nourished  HEENT: Within normal limits Neck: Normal, supple no cervical adenopathy  Heart: Regular rate and rhythm no murmurs, rubs or gallops Lungs: Clear to auscultation except for mild scattered crackles in right lower lobe, no dyspnea, normal chest expansion Skin: normal color, no cyanosis, capillary refill normal   Assessment: Biometric screen 1. Encounter for other general examination- brief biometric exam and biometric screening not a full annual physical    2. Encounter for biometric screening   3. Pneumonia of right lower lobe due to infectious organism [facility])- post covid       Plan:  He has lab orders from his primary care and a [facility] order form- he has not gone to [facility] yet and the labs were not able to be done in this clinic. He is advised he should go today for these labs McLean-Scocuzza, Nino Glow, MD.   Orders Placed This Encounter  Procedures  . Comprehensive metabolic panel  . Glucose, random  Discussed deep breathing exercises, levaquin black box warning and all medications below. Use inhaler as needed not more than prescribed.  Follow up with McLean-Scocuzza, Pasty Spillersracy N, MD within 1 week and sooner if needed. Should have repeat imaging within 1 month of treatment to be sure clearing and sooner if any symptoms worsen or change  and recheck of lung sounds. Discussed RED Flag symptoms with patient and when to seek emergency  medical treatment.  Meds ordered this encounter  Medications  . levofloxacin (LEVAQUIN) 500 MG tablet    Sig: Take 1 tablet (500 mg total) by mouth daily.    Dispense:  7 tablet    Refill:  0  . guaiFENesin (MUCINEX) 600 MG 12 hr tablet    Sig: Take 1 tablet (600 mg total) by mouth 2 (two) times daily.    Dispense:  20 tablet    Refill:  0   Fasting glucose and lipids. Discussed with patient that today's visit here is a limited biometric screening visit (not a comprehensive exam or management of any chronic problems) Discussed some health issues, including healthy eating habits and exercise. Encouraged to follow-up with PCP for annual comprehensive preventive and wellness care (and if applicable, any chronic issues). Questions invited and answered.   I will have the office call you on your glucose and cholesterol results when they return if you have not heard within 1 week please call the office.  This biometric physical is a brief physical and the only labs done are glucose and your lipid panel(cholesterol) and is  not a substitute for seeing a primary care provider for a complete annual physical. Please see a primary care physician for routine health maintenance, labs and full physical at least yearly and follow up as recommended by your provider. Provider also recommends if you do not have a primary care provider for patient to establish care as soon as possible .Patient may chose provider of choice. Also gave the [place]  PHYSICIAN/PROVIDER  REFERRAL LINE at [phone] or web site at [email or web address] to help assist with finding a primary care doctor.  Patient verbalizes understanding that his office is acute care only and not a substitute for a primary care or for the management of chronic conditions.    Advised patient call the office or your primary care doctor for an appointment if no improvement within 72 hours or if any symptoms change or worsen at any time  Advised ER or  urgent Care if after hours or on weekend. Call 911 for emergency symptoms at any time.Patinet verbalized understanding of all instructions given/reviewed and treatment plan and has no further questions or concerns at this time.    Return in about 1 week (around 06/22/2019), or with your primary care provider and sooner if needed/ repat imaging in 1 month, for at any time for any worsening symptoms, Go to Emergency room/ urgent care if worse.

## 2019-06-15 NOTE — Patient Instructions (Addendum)
Return in about 1 week (around 06/22/2019), or with your primary care provider and sooner if needed/ repat imaging in 1 month, for at any time for any worsening symptoms, Go to Emergency room/ urgent care if worse. Follow up with primary care as needed for chronic and maintenance health care- can be seen in this employee clinic for acute care. ,  I will have the office call you on your glucose and cholesterol results when they return if you have not heard within 1 week please call the office.  This biometric physical is a brief physical and the only labs done are glucose and your lipid panel(cholesterol) and is  not a substitute for seeing a primary care provider for a complete annual physical. Please see a primary care physician for routine health maintenance, labs and full physical at least yearly and follow up as recommended by your provider. Provider also recommends if you do not have a primary care provider for patient to establish care as soon as possible .Patient may chose provider of choice. Also gave the [place]  PHYSICIAN/PROVIDER  REFERRAL LINE at [phone] or web site at [email or web address] to help assist with finding a primary care doctor.  Patient verbalizes understanding that his office is acute care only and not a substitute for a primary care or for the management of chronic conditions.     Health Maintenance, Male Adopting a healthy lifestyle and getting preventive care are important in promoting health and wellness. Ask your health care provider about:  The right schedule for you to have regular tests and exams.  Things you can do on your own to prevent diseases and keep yourself healthy. What should I know about diet, weight, and exercise? Eat a healthy diet   Eat a diet that includes plenty of vegetables, fruits, low-fat dairy products, and lean protein.  Do not eat a lot of foods that are high in solid fats, added sugars, or sodium. Maintain a healthy weight Body  mass index (BMI) is a measurement that can be used to identify possible weight problems. It estimates body fat based on height and weight. Your health care provider can help determine your BMI and help you achieve or maintain a healthy weight. Get regular exercise Get regular exercise. This is one of the most important things you can do for your health. Most adults should:  Exercise for at least 150 minutes each week. The exercise should increase your heart rate and make you sweat (moderate-intensity exercise).  Do strengthening exercises at least twice a week. This is in addition to the moderate-intensity exercise.  Spend less time sitting. Even light physical activity can be beneficial. Watch cholesterol and blood lipids Have your blood tested for lipids and cholesterol at 29 years of age, then have this test every 5 years. You may need to have your cholesterol levels checked more often if:  Your lipid or cholesterol levels are high.  You are older than 29 years of age.  You are at high risk for heart disease. What should I know about cancer screening? Many types of cancers can be detected early and may often be prevented. Depending on your health history and family history, you may need to have cancer screening at various ages. This may include screening for:  Colorectal cancer.  Prostate cancer.  Skin cancer.  Lung cancer. What should I know about heart disease, diabetes, and high blood pressure? Blood pressure and heart disease  High blood pressure causes heart disease  and increases the risk of stroke. This is more likely to develop in people who have high blood pressure readings, are of African descent, or are overweight.  Talk with your health care provider about your target blood pressure readings.  Have your blood pressure checked: ? Every 3-5 years if you are 51-24 years of age. ? Every year if you are 29 years old or older.  If you are between the ages of 64 and 62  and are a current or former smoker, ask your health care provider if you should have a one-time screening for abdominal aortic aneurysm (AAA). Diabetes Have regular diabetes screenings. This checks your fasting blood sugar level. Have the screening done:  Once every three years after age 30 if you are at a normal weight and have a low risk for diabetes.  More often and at a younger age if you are overweight or have a high risk for diabetes. What should I know about preventing infection? Hepatitis B If you have a higher risk for hepatitis B, you should be screened for this virus. Talk with your health care provider to find out if you are at risk for hepatitis B infection. Hepatitis C Blood testing is recommended for:  Everyone born from [birth year] through 1966.  Anyone with known risk factors for hepatitis C. Sexually transmitted infections (STIs)  You should be screened each year for STIs, including gonorrhea and chlamydia, if: ? You are sexually active and are younger than 29 years of age. ? You are older than 29 years of age and your health care provider tells you that you are at risk for this type of infection. ? Your sexual activity has changed since you were last screened, and you are at increased risk for chlamydia or gonorrhea. Ask your health care provider if you are at risk.  Ask your health care provider about whether you are at high risk for HIV. Your health care provider may recommend a prescription medicine to help prevent HIV infection. If you choose to take medicine to prevent HIV, you should first get tested for HIV. You should then be tested every 3 months for as long as you are taking the medicine. Follow these instructions at home: Lifestyle  Do not use any products that contain nicotine or tobacco, such as cigarettes, e-cigarettes, and chewing tobacco. If you need help quitting, ask your health care provider.  Do not use street drugs.  Do not share needles.  Ask your  health care provider for help if you need support or information about quitting drugs. Alcohol use  Do not drink alcohol if your health care provider tells you not to drink.  If you drink alcohol: ? Limit how much you have to 0-2 drinks a day. ? Be aware of how much alcohol is in your drink. In the [place] one drink equals one 12 oz bottle of beer (355 mL), one 5 oz glass of wine (148 mL), or one 1 oz glass of hard liquor (44 mL). General instructions  Schedule regular health, dental, and eye exams.  Stay current with your vaccines.  Tell your health care provider if: ? You often feel depressed. ? You have ever been abused or do not feel safe at home. Summary  Adopting a healthy lifestyle and getting preventive care are important in promoting health and wellness.  Follow your health care provider's instructions about healthy diet, exercising, and getting tested or screened for diseases.  Follow your health care provider's instructions on  monitoring your cholesterol and blood pressure. This information is not intended to replace advice given to you by your health care provider. Make sure you discuss any questions you have with your health care provider. Document Released: 04/12/2008 Document Revised: 10/08/2018 Document Reviewed: 10/08/2018 Elsevier Patient Education  2020 Elsevier Inc. Cough, Adult A cough helps to clear your throat and lungs. A cough may be a sign of an illness or another medical condition. An acute cough may only last 2-3 weeks, while a chronic cough may last 8 or more weeks. Many things can cause a cough. They include:  Germs (viruses or bacteria) that attack the airway.  Breathing in things that bother (irritate) your lungs.  Allergies.  Asthma.  Mucus that runs down the back of your throat (postnasal drip).  Smoking.  Acid backing up from the stomach into the tube that moves food from the mouth to the stomach (gastroesophageal reflux).  Some  medicines.  Lung problems.  Other medical conditions, such as heart failure or a blood clot in the lung (pulmonary embolism). Follow these instructions at home: Medicines  Take over-the-counter and prescription medicines only as told by your doctor.  Talk with your doctor before you take medicines that stop a cough (coughsuppressants). Lifestyle   Do not smoke, and try not to be around smoke. Do not use any products that contain nicotine or tobacco, such as cigarettes, e-cigarettes, and chewing tobacco. If you need help quitting, ask your doctor.  Drink enough fluid to keep your pee (urine) pale yellow.  Avoid caffeine.  Do not drink alcohol if your doctor tells you not to drink. General instructions   Watch for any changes in your cough. Tell your doctor about them.  Always cover your mouth when you cough.  Stay away from things that make you cough, such as perfume, candles, campfire smoke, or cleaning products.  If the air is dry, use a cool mist vaporizer or humidifier in your home.  If your cough is worse at night, try using extra pillows to raise your head up higher while you sleep.  Rest as needed.  Keep all follow-up visits as told by your doctor. This is important. Contact a doctor if:  You have new symptoms.  You cough up pus.  Your cough does not get better after 2-3 weeks, or your cough gets worse.  Cough medicine does not help your cough and you are not sleeping well.  You have pain that gets worse or pain that is not helped with medicine.  You have a fever.  You are losing weight and you do not know why.  You have night sweats. Get help right away if:  You cough up blood.  You have trouble breathing.  Your heartbeat is very fast. These symptoms may be an emergency. Do not wait to see if the symptoms will go away. Get medical help right away. Call your local emergency services (911 in the [place] Do not drive yourself to the hospital. Summary   A cough helps to clear your throat and lungs. Many things can cause a cough.  Take over-the-counter and prescription medicines only as told by your doctor.  Always cover your mouth when you cough.  Contact a doctor if you have new symptoms or you have a cough that does not get better or gets worse. This information is not intended to replace advice given to you by your health care provider. Make sure you discuss any questions you have with your health  care provider. Document Released: 06/28/2011 Document Revised: 11/03/2018 Document Reviewed: 11/03/2018 Elsevier Patient Education  2020 Elsevier Inc.  Community-Acquired Pneumonia, Adult Pneumonia is an infection of the lungs. It causes swelling in the airways of the lungs. Mucus and fluid may also build up inside the airways. One type of pneumonia can happen while a person is in a hospital. A different type can happen when a person is not in a hospital (community-acquired pneumonia).  What are the causes?  This condition is caused by germs (viruses, bacteria, or fungi). Some types of germs can be passed from one person to another. This can happen when you breathe in droplets from the cough or sneeze of an infected person. What increases the risk? You are more likely to develop this condition if you:  Have a long-term (chronic) disease, such as: ? Chronic obstructive pulmonary disease (COPD). ? Asthma. ? Cystic fibrosis. ? Congestive heart failure. ? Diabetes. ? Kidney disease.  Have HIV.  Have sickle cell disease.  Have had your spleen removed.  Do not take good care of your teeth and mouth (poor dental hygiene).  Have a medical condition that increases the risk of breathing in droplets from your own mouth and nose.  Have a weakened body defense system (immune system).  Are a smoker.  Travel to areas where the germs that cause this illness are common.  Are around certain animals or the places they live. What are the  signs or symptoms?  A dry cough.  A wet (productive) cough.  Fever.  Sweating.  Chest pain. This often happens when breathing deeply or coughing.  Fast breathing or trouble breathing.  Shortness of breath.  Shaking chills.  Feeling tired (fatigue).  Muscle aches. How is this treated? Treatment for this condition depends on many things. Most adults can be treated at home. In some cases, treatment must happen in a hospital. Treatment may include:  Medicines given by mouth or through an IV tube.  Being given extra oxygen.  Respiratory therapy. In rare cases, treatment for very bad pneumonia may include:  Using a machine to help you breathe.  Having a procedure to remove fluid from around your lungs. Follow these instructions at home: Medicines  Take over-the-counter and prescription medicines only as told by your doctor. ? Only take cough medicine if you are losing sleep.  If you were prescribed an antibiotic medicine, take it as told by your doctor. Do not stop taking the antibiotic even if you start to feel better. General instructions   Sleep with your head and neck raised (elevated). You can do this by sleeping in a recliner or by putting a few pillows under your head.  Rest as needed. Get at least 8 hours of sleep each night.  Drink enough water to keep your pee (urine) pale yellow.  Eat a healthy diet that includes plenty of vegetables, fruits, whole grains, low-fat dairy products, and lean protein.  Do not use any products that contain nicotine or tobacco. These include cigarettes, e-cigarettes, and chewing tobacco. If you need help quitting, ask your doctor.  Keep all follow-up visits as told by your doctor. This is important. How is this prevented? A shot (vaccine) can help prevent pneumonia. Shots are often suggested for:  People older than 29 years of age.  People older than 29 years of age who: ? Are having cancer treatment. ? Have long-term  (chronic) lung disease. ? Have problems with their body's defense system. You may also prevent  pneumonia if you take these actions:  Get the flu (influenza) shot every year.  Go to the dentist as often as told.  Wash your hands often. If you cannot use soap and water, use hand sanitizer. Contact a doctor if:  You have a fever.  You lose sleep because your cough medicine does not help. Get help right away if:  You are short of breath and it gets worse.  You have more chest pain.  Your sickness gets worse. This is very serious if: ? You are an older adult. ? Your body's defense system is weak.  You cough up blood. Summary  Pneumonia is an infection of the lungs.  Most adults can be treated at home. Some will need treatment in a hospital.  Drink enough water to keep your pee pale yellow.  Get at least 8 hours of sleep each night. This information is not intended to replace advice given to you by your health care provider. Make sure you discuss any questions you have with your health care provider. Document Released: 04/02/2008 Document Revised: 02/04/2019 Document Reviewed: 06/12/2018 Elsevier Patient Education  2020 [employer].

## 2019-06-16 LAB — COMPREHENSIVE METABOLIC PANEL
ALT: 80 IU/L — ABNORMAL HIGH (ref 0–44)
AST: 34 IU/L (ref 0–40)
Albumin/Globulin Ratio: 2 (ref 1.2–2.2)
Albumin: 4.6 g/dL (ref 4.1–5.2)
Alkaline Phosphatase: 91 IU/L (ref 39–117)
BUN/Creatinine Ratio: 24 — ABNORMAL HIGH (ref 9–20)
BUN: 25 mg/dL — ABNORMAL HIGH (ref 6–20)
Bilirubin Total: 0.2 mg/dL (ref 0.0–1.2)
CO2: 24 mmol/L (ref 20–29)
Calcium: 9.3 mg/dL (ref 8.7–10.2)
Chloride: 103 mmol/L (ref 96–106)
Creatinine, Ser: 1.04 mg/dL (ref 0.76–1.27)
GFR calc Af Amer: 112 mL/min/{1.73_m2} (ref >59)
GFR calc non Af Amer: 97 mL/min/{1.73_m2} (ref >59)
Globulin, Total: 2.3 g/dL (ref 1.5–4.5)
Glucose: 95 mg/dL (ref 65–99)
Potassium: 4.4 mmol/L (ref 3.5–5.2)
Sodium: 141 mmol/L (ref 134–144)
Total Protein: 6.9 g/dL (ref 6.0–8.5)

## 2019-06-16 NOTE — Addendum Note (Signed)
Addended by: Judie Petit on: 06/16/2019 11:21 AM   Modules accepted: Orders

## 2019-06-19 LAB — LIPID PANEL WITH LDL/HDL RATIO
Cholesterol, Total: 269 mg/dL — ABNORMAL HIGH (ref 100–199)
HDL: 40 mg/dL (ref >39)
LDL Calculated: 180 mg/dL — ABNORMAL HIGH (ref 0–99)
LDl/HDL Ratio: 4.5 ratio — ABNORMAL HIGH (ref 0.0–3.6)
Triglycerides: 245 mg/dL — ABNORMAL HIGH (ref 0–149)
VLDL Cholesterol Cal: 49 mg/dL — ABNORMAL HIGH (ref 5–40)

## 2019-06-19 LAB — SPECIMEN STATUS REPORT

## 2019-06-24 ENCOUNTER — Encounter: Payer: Self-pay | Admitting: *Deleted

## 2019-09-01 IMAGING — CT CT ANGIOGRAPHY CHEST
2 of 7 series · 13 of 36 positions shown · IV contrast (omnipaque)
Comparison: Chest radiograph May 08, 2019

CLINICAL DATA: Cough and shortness of breath. Elevated D-dimer
reportedly recent [id number] positive

EXAM:
CT ANGIOGRAPHY CHEST WITH CONTRAST
TECHNIQUE: Multidetector CT imaging of the chest was performed using the
standard protocol during bolus administration of intravenous
contrast. Multiplanar CT image reconstructions and MIPs were
obtained to evaluate the vascular anatomy.
CONTRAST:  75mL OMNIPAQUE IOHEXOL 350 MG/ML SOLN

[Series 7: thins cta pe · axial · 0.62mm/px · z∈[-1162,-930]mm · 12 of 276 slices shown]
[im 22/276  lung]
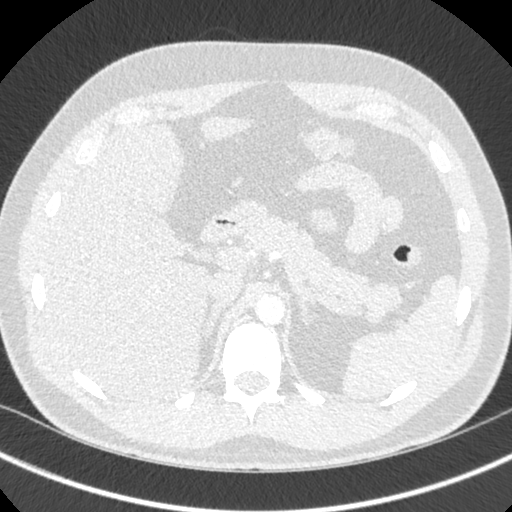
[im 43/276  mediastinal]
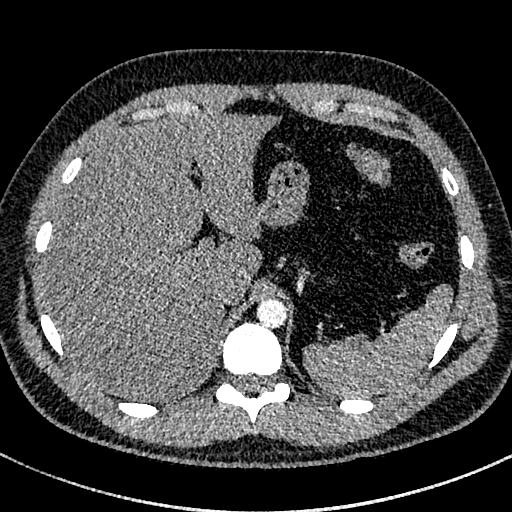
[im 64/276  lung]
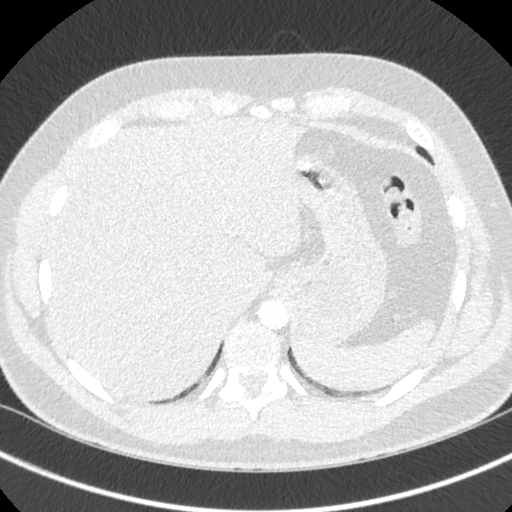
[im 85/276  mediastinal]
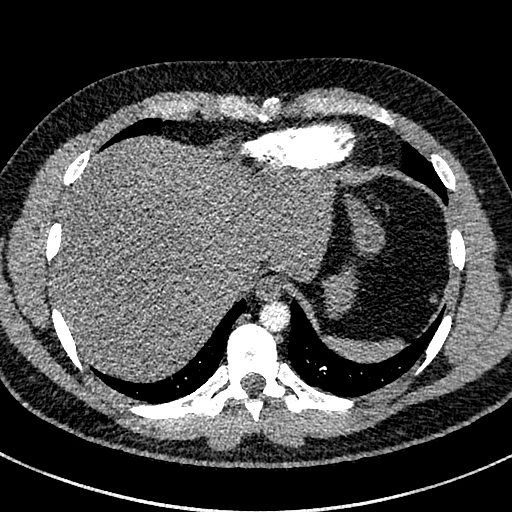
[im 106/276  lung]
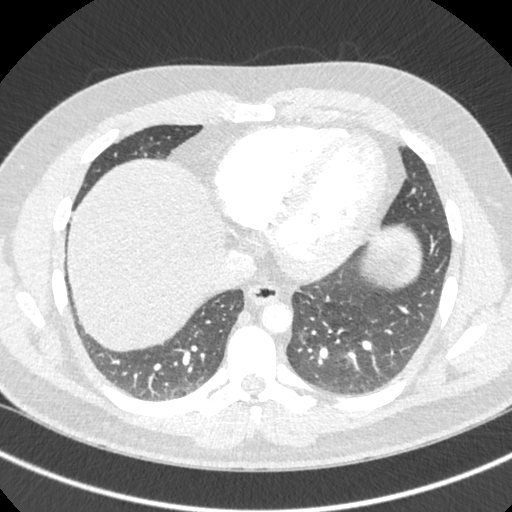
[im 127/276  mediastinal]
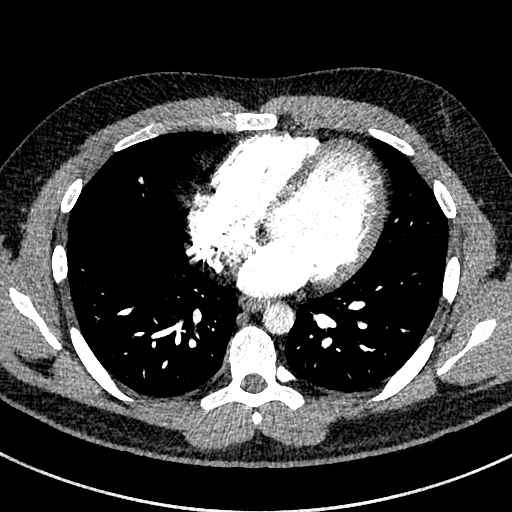
[im 149/276  lung]
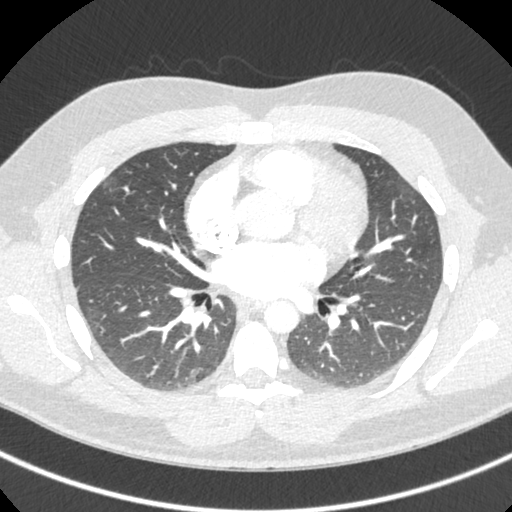
[im 170/276  mediastinal]
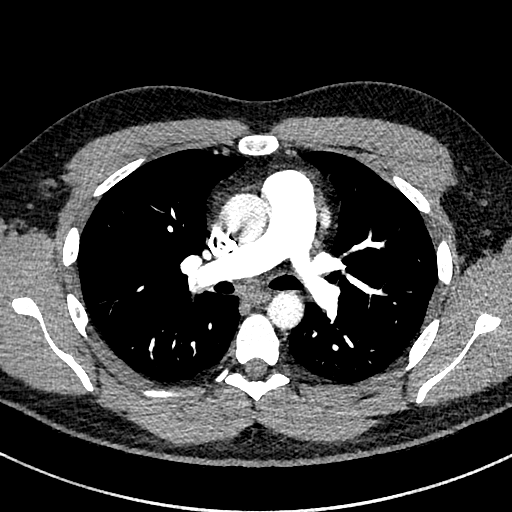
[im 191/276  lung]
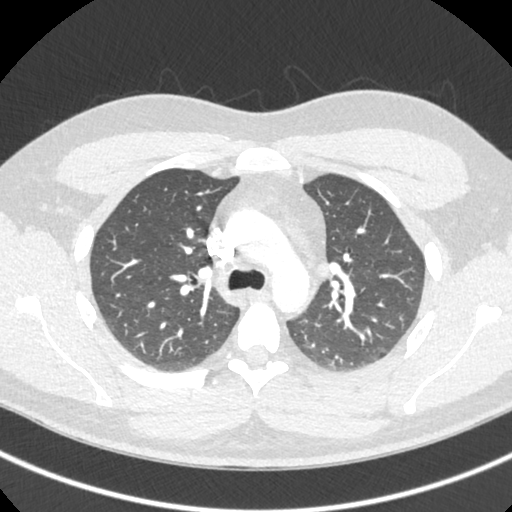
[im 212/276  mediastinal]
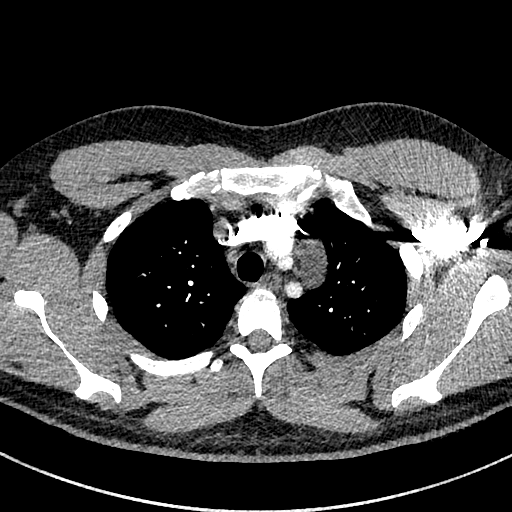
[im 233/276  lung]
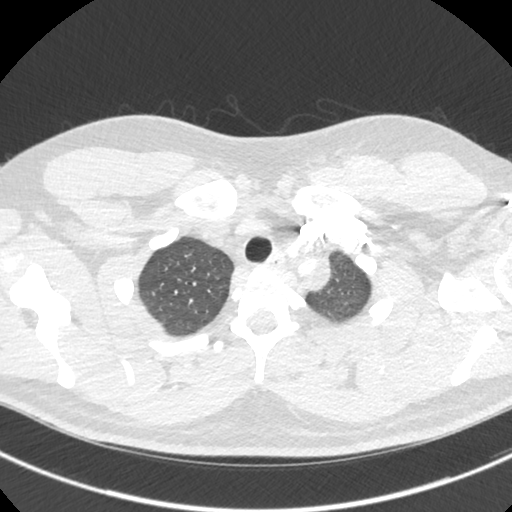
[im 254/276  mediastinal]
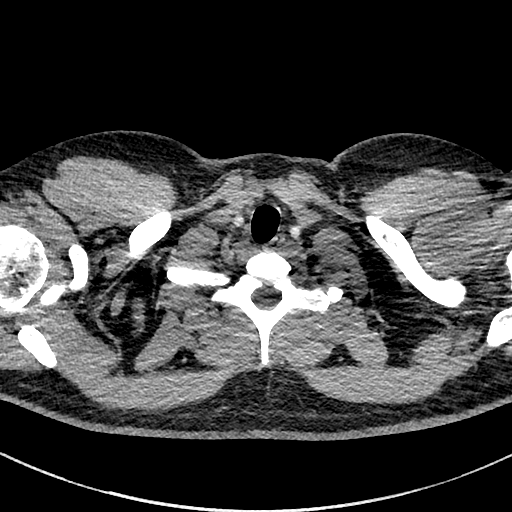

[Series 8: coronal cta pe · coronal · 0.54mm/px · 1 of 136 slices shown]
[im 68/136  mediastinal]
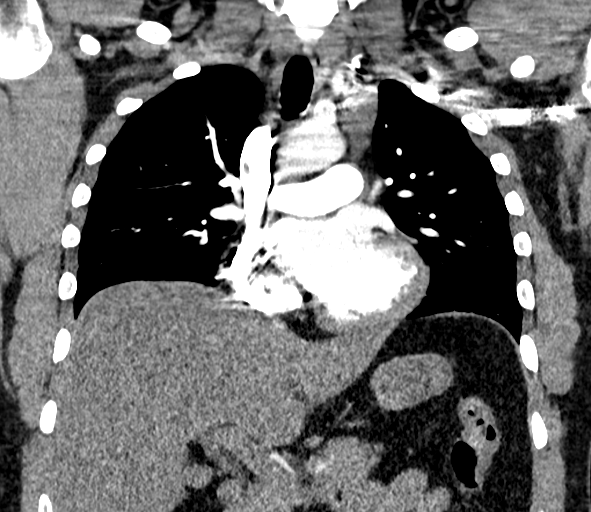

[13 of 36 positions shown; findings below may reference images not displayed]

FINDINGS: Cardiovascular: There is no demonstrable pulmonary embolus. There is
no thoracic aortic aneurysm or dissection. Visualized great vessels
appear unremarkable. No pericardial thickening evident. There is
focal fluid adjacent to the aortic arch on the left which may be
fluid within a pericardial recess. No pericardial effusion
surrounding the heart.

Mediastinum/Nodes: Visualized thyroid appears normal. Fluid
collection to the left of the aortic arch and great vessels on the
left measures 3.7 x 3.5 x 2.0 cm. Suspect fluid in a pericardial
recess. A duplication cyst potentially could present in this manner
arising in the lateral anterior mediastinum. This structure appears
benign. There is no evident thoracic adenopathy. No esophageal
lesions are evident.

Lungs/Pleura: There are foci of atelectatic change in each lower
lobe. There is no frank edema or consolidation. No evident pleural
effusion. Note that in the right lower lobe, areas of apparent
atelectasis have a subtly ground-glass type appearance which may
represent residua of viral pneumonitis.

Upper Abdomen: Visualized upper abdominal structures appear
unremarkable.

Musculoskeletal: No blastic or lytic bone lesions. No chest wall
lesions evident.

Review of the MIP images confirms the above findings.
IMPRESSION: 1. No demonstrable pulmonary embolus. No thoracic aortic aneurysm or
dissection.

2. Fluid containing structure to the left of the proximal great
vessels and aortic arch, consistent with either fluid within a
pericardial recess or possibly a mediastinal duplication cyst. This
structure appears benign.

3. Areas of mild atelectatic change. A slight degree of residual
viral pneumonitis may be present in the right lower lobe. Note that
patient reports recently having coronavirus. There is no airspace
consolidation. No pleural effusion.

4.  No evident adenopathy.

## 2019-09-09 ENCOUNTER — Encounter: Payer: Self-pay | Admitting: Internal Medicine

## 2019-09-09 ENCOUNTER — Other Ambulatory Visit: Payer: Self-pay

## 2019-09-09 ENCOUNTER — Ambulatory Visit (INDEPENDENT_AMBULATORY_CARE_PROVIDER_SITE_OTHER): Payer: Managed Care, Other (non HMO) | Admitting: Internal Medicine

## 2019-09-09 VITALS — Ht 65.0 in | Wt 185.0 lb

## 2019-09-09 DIAGNOSIS — J0100 Acute maxillary sinusitis, unspecified: Secondary | ICD-10-CM | POA: Diagnosis not present

## 2019-09-09 DIAGNOSIS — Z0000 Encounter for general adult medical examination without abnormal findings: Secondary | ICD-10-CM

## 2019-09-09 DIAGNOSIS — R059 Cough, unspecified: Secondary | ICD-10-CM

## 2019-09-09 DIAGNOSIS — E559 Vitamin D deficiency, unspecified: Secondary | ICD-10-CM

## 2019-09-09 DIAGNOSIS — J189 Pneumonia, unspecified organism: Secondary | ICD-10-CM

## 2019-09-09 DIAGNOSIS — U071 COVID-19: Secondary | ICD-10-CM | POA: Diagnosis not present

## 2019-09-09 DIAGNOSIS — R05 Cough: Secondary | ICD-10-CM | POA: Diagnosis not present

## 2019-09-09 DIAGNOSIS — E785 Hyperlipidemia, unspecified: Secondary | ICD-10-CM

## 2019-09-09 DIAGNOSIS — Z1389 Encounter for screening for other disorder: Secondary | ICD-10-CM

## 2019-09-09 DIAGNOSIS — Z1329 Encounter for screening for other suspected endocrine disorder: Secondary | ICD-10-CM

## 2019-09-09 MED ORDER — FLUTICASONE PROPIONATE 50 MCG/ACT NA SUSP: 2.0000 | Freq: Every day | NASAL | 11 refills | Status: DC

## 2019-09-09 MED ORDER — GUAIFENESIN ER 600 MG PO TB12: 600.0000 mg | ORAL_TABLET | Freq: Two times a day (BID) | ORAL | 2 refills | Status: DC

## 2019-09-09 MED ORDER — SALINE SPRAY 0.65 % NA SOLN: 2.0000 | NASAL | 11 refills | Status: DC | PRN

## 2019-09-09 MED ORDER — ALBUTEROL SULFATE HFA 108 (90 BASE) MCG/ACT IN AERS: 1.0000 | INHALATION_SPRAY | RESPIRATORY_TRACT | 2 refills | Status: DC | PRN

## 2019-09-09 MED ORDER — AZITHROMYCIN 250 MG PO TABS: ORAL_TABLET | ORAL | 0 refills | Status: DC

## 2019-09-09 NOTE — Progress Notes (Signed)
Virtual Visit via Video Note  I connected with Nicholas Barber   on 09/09/19 at  8:30 AM EST by a video enabled telemedicine application and verified that I am speaking with the correct person using two identifiers.  Location patient: home Location provider:work or home office Persons participating in the virtual visit: patient, provider  I discussed the limitations of evaluation and management by telemedicine and the availability of in person appointments. The patient expressed understanding and agreed to proceed.   HPI: 1. Annual  2. C/o sinusitis with nasal congestion with green mucous, pressure on cheeks x 2 week worse in the am, no fever nothing tried. He is allergic to pollen and this is worse over the last 2 years  3. covid 19 + in the past with continue wheezing and cough and overall feels better as far as fatigue but wants albuterol inhaler again. Denies h/o asthma  4. HLD not exercising but trying to change diet but needs food list    ROS: See pertinent positives and negatives per HPI. General: fatigue improved  HEENT: sinus pressure cheeks + CV: no chest pain  Lungs: +cough and wheezing  GI: no ab pain  GU: no issues  MSK: no joint pain  Skin: no issues  Neuro: no headache  Psych: no issues   Past Medical History:  Diagnosis Date  . COVID-19 virus infection    05/06/19    Past Surgical History:  Procedure Laterality Date  . NO PAST SURGERIES      Family History  Problem Relation Age of Onset  . Cancer Maternal Grandfather        skin   . Macular degeneration Maternal Grandfather   . Early death Neg Hx   . Heart disease Neg Hx     SOCIAL HX:  DPR mom Nicholas Barber [phone] Works at [employer] at home    Current Outpatient Medications:  .  albuterol (VENTOLIN HFA) 108 (90 Base) MCG/ACT inhaler, Inhale 1-2 puffs into the lungs every 4 (four) hours as needed for wheezing or shortness of breath., Disp: 18 g, Rfl: 2 .  azithromycin (ZITHROMAX) 250  MG tablet, 2 pills day 1 and 1 pill day 2-5, Disp: 6 tablet, Rfl: 0 .  fluticasone (FLONASE) 50 MCG/ACT nasal spray, Place 2 sprays into both nostrils daily. Prn max 2 sprays each nose. Use after saline, Disp: 16 g, Rfl: 11 .  guaiFENesin (MUCINEX) 600 MG 12 hr tablet, Take 1 tablet (600 mg total) by mouth 2 (two) times daily. Prn, Disp: 60 tablet, Rfl: 2 .  sodium chloride (OCEAN) 0.65 % SOLN nasal spray, Place 2 sprays into both nostrils as needed for congestion., Disp: 30 mL, Rfl: 11  EXAM:  VITALS per patient if applicable:  GENERAL: alert, oriented, appears well and in no acute distress  HEENT: atraumatic, conjunttiva clear, no obvious abnormalities on inspection of external nose and ears  NECK: normal movements of the head and neck  LUNGS: on inspection no signs of respiratory distress, breathing rate appears normal, no obvious gross SOB, gasping or wheezing  CV: no obvious cyanosis  MS: moves all visible extremities without noticeable abnormality  PSYCH/NEURO: pleasant and cooperative, no obvious depression or anxiety, speech and thought processing grossly intact  ASSESSMENT AND PLAN:  Discussed the following assessment and plan:  Annual physical exam sch [facility] 12/2019 f/u lipid, vit D, TSH UA Flu never had   utd Tdap  HIV and hep C  negative  rec healthy diet and exercise   COVID-19 virus infection with residual wheezing with h/o viral pneumonia no h/o asthma Prn albuterol inhaler   Cough - Plan: albuterol (VENTOLIN HFA) 108 (90 Base) MCG/ACT inhaler, guaiFENesin (MUCINEX) 600 MG 12 hr tablet  Acute maxillary sinusitis, recurrence not specified - Plan: sodium chloride (OCEAN) 0.65 % SOLN nasal spray, fluticasone (FLONASE) 50 MCG/ACT nasal spray, azithromycin (ZITHROMAX) 250 MG tablet, guaiFENesin (MUCINEX) 600 MG 12 hr tablet Consider ENT if continues   Hyperlipidemia, unspecified hyperlipidemia type - Plan: Lipid panel Given list of foods  rec  exercise    Vitamin D deficiency - Plan: Vitamin D (25 hydroxy)  -we discussed possible serious and likely etiologies, options for evaluation and workup, limitations of telemedicine visit vs in person visit, treatment, treatment risks and precautions. Pt prefers to treat via telemedicine empirically rather then risking or undertaking an in person visit at this moment. Patient agrees to seek prompt in person care if worsening, new symptoms arise, or if is not improving with treatment.   I discussed the assessment and treatment plan with the patient. The patient was provided an opportunity to ask questions and all were answered. The patient agreed with the plan and demonstrated an understanding of the instructions.   The patient was advised to call back or seek an in-person evaluation if the symptoms worsen or if the condition fails to improve as anticipated.  Time spent 15 minutes  Delorise Jackson, MD

## 2019-09-09 NOTE — Patient Instructions (Signed)
High Cholesterol  High cholesterol is a condition in which the blood has high levels of a white, waxy, fat-like substance (cholesterol). The human body needs small amounts of cholesterol. The liver makes all the cholesterol that the body needs. Extra (excess) cholesterol comes from the food that we eat. Cholesterol is carried from the liver by the blood through the blood vessels. If you have high cholesterol, deposits (plaques) may build up on the walls of your blood vessels (arteries). Plaques make the arteries narrower and stiffer. Cholesterol plaques increase your risk for heart attack and stroke. Work with your health care provider to keep your cholesterol levels in a healthy range. What increases the risk? This condition is more likely to develop in people who:  Eat foods that are high in animal fat (saturated fat) or cholesterol.  Are overweight.  Are not getting enough exercise.  Have a family history of high cholesterol. What are the signs or symptoms? There are no symptoms of this condition. How is this diagnosed? This condition may be diagnosed from the results of a blood test.  If you are older than age 20, your health care provider may check your cholesterol every 4-6 years.  You may be checked more often if you already have high cholesterol or other risk factors for heart disease. The blood test for cholesterol measures:  "Bad" cholesterol (LDL cholesterol). This is the main type of cholesterol that causes heart disease. The desired level for LDL is less than 100.  "Good" cholesterol (HDL cholesterol). This type helps to protect against heart disease by cleaning the arteries and carrying the LDL away. The desired level for HDL is 60 or higher.  Triglycerides. These are fats that the body can store or burn for energy. The desired number for triglycerides is lower than 150.  Total cholesterol. This is a measure of the total amount of cholesterol in your blood, including LDL  cholesterol, HDL cholesterol, and triglycerides. A healthy number is less than 200. How is this treated? This condition is treated with diet changes, lifestyle changes, and medicines. Diet changes  This may include eating more whole grains, fruits, vegetables, nuts, and fish.  This may also include cutting back on red meat and foods that have a lot of added sugar. Lifestyle changes  Changes may include getting at least 40 minutes of aerobic exercise 3 times a week. Aerobic exercises include walking, biking, and swimming. Aerobic exercise along with a healthy diet can help you maintain a healthy weight.  Changes may also include quitting smoking. Medicines  Medicines are usually given if diet and lifestyle changes have failed to reduce your cholesterol to healthy levels.  Your health care provider may prescribe a statin medicine. Statin medicines have been shown to reduce cholesterol, which can reduce the risk of heart disease. Follow these instructions at home: Eating and drinking If told by your health care provider:  Eat chicken (without skin), fish, veal, shellfish, ground turkey breast, and round or loin cuts of red meat.  Do not eat fried foods or fatty meats, such as hot dogs and salami.  Eat plenty of fruits, such as apples.  Eat plenty of vegetables, such as broccoli, potatoes, and carrots.  Eat beans, peas, and lentils.  Eat grains such as barley, rice, couscous, and bulgur wheat.  Eat pasta without cream sauces.  Use skim or nonfat milk, and eat low-fat or nonfat yogurt and cheeses.  Do not eat or drink whole milk, cream, ice cream, egg yolks,   or hard cheeses.  Do not eat stick margarine or tub margarines that contain trans fats (also called partially hydrogenated oils).  Do not eat saturated tropical oils, such as coconut oil and palm oil.  Do not eat cakes, cookies, crackers, or other baked goods that contain trans fats.  General instructions  Exercise as  directed by your health care provider. Increase your activity level with activities such as gardening, walking, and taking the stairs.  Take over-the-counter and prescription medicines only as told by your health care provider.  Do not use any products that contain nicotine or tobacco, such as cigarettes and e-cigarettes. If you need help quitting, ask your health care provider.  Keep all follow-up visits as told by your health care provider. This is important. Contact a health care provider if:  You are struggling to maintain a healthy diet or weight.  You need help to start on an exercise program.  You need help to stop smoking. Get help right away if:  You have chest pain.  You have trouble breathing. This information is not intended to replace advice given to you by your health care provider. Make sure you discuss any questions you have with your health care provider. Document Released: 10/15/2005 Document Revised: 10/18/2017 Document Reviewed: 04/14/2016 Elsevier Patient Education  2020 Elsevier Inc.  Cholesterol Content in Foods Cholesterol is a waxy, fat-like substance that helps to carry fat in the blood. The body needs cholesterol in small amounts, but too much cholesterol can cause damage to the arteries and heart. Most people should eat less than 200 milligrams (mg) of cholesterol a day. Foods with cholesterol  Cholesterol is found in animal-based foods, such as meat, seafood, and dairy. Generally, low-fat dairy and lean meats have less cholesterol than full-fat dairy and fatty meats. The milligrams of cholesterol per serving (mg per serving) of common cholesterol-containing foods are listed below. Meat and other proteins  Egg - one large whole egg has 186 mg.  Veal shank - 4 oz has 141 mg.  Lean ground turkey (93% lean) - 4 oz has 118 mg.  Fat-trimmed lamb loin - 4 oz has 106 mg.  Lean ground beef (90% lean) - 4 oz has 100 mg.  Lobster - 3.5 oz has 90 mg.  Pork  loin chops - 4 oz has 86 mg.  Canned salmon - 3.5 oz has 83 mg.  Fat-trimmed beef top loin - 4 oz has 78 mg.  Frankfurter - 1 frank (3.5 oz) has 77 mg.  Crab - 3.5 oz has 71 mg.  Roasted chicken without skin, white meat - 4 oz has 66 mg.  Light bologna - 2 oz has 45 mg.  Deli-cut turkey - 2 oz has 31 mg.  Canned tuna - 3.5 oz has 31 mg.  Bacon - 1 oz has 29 mg.  Oysters and mussels (raw) - 3.5 oz has 25 mg.  Mackerel - 1 oz has 22 mg.  Trout - 1 oz has 20 mg.  Pork sausage - 1 link (1 oz) has 17 mg.  Salmon - 1 oz has 16 mg.  Tilapia - 1 oz has 14 mg. Dairy  Soft-serve ice cream -  cup (4 oz) has 103 mg.  Whole-milk yogurt - 1 cup (8 oz) has 29 mg.  Cheddar cheese - 1 oz has 28 mg.  American cheese - 1 oz has 28 mg.  Whole milk - 1 cup (8 oz) has 23 mg.  2% milk - 1 cup (8   oz) has 18 mg.  Cream cheese - 1 tablespoon (Tbsp) has 15 mg.  Cottage cheese -  cup (4 oz) has 14 mg.  Low-fat (1%) milk - 1 cup (8 oz) has 10 mg.  Sour cream - 1 Tbsp has 8.5 mg.  Low-fat yogurt - 1 cup (8 oz) has 8 mg.  Nonfat Greek yogurt - 1 cup (8 oz) has 7 mg.  Half-and-half cream - 1 Tbsp has 5 mg. Fats and oils  Cod liver oil - 1 tablespoon (Tbsp) has 82 mg.  Butter - 1 Tbsp has 15 mg.  Lard - 1 Tbsp has 14 mg.  Bacon grease - 1 Tbsp has 14 mg.  Mayonnaise - 1 Tbsp has 5-10 mg.  Margarine - 1 Tbsp has 3-10 mg. Exact amounts of cholesterol in these foods may vary depending on specific ingredients and brands. Foods without cholesterol Most plant-based foods do not have cholesterol unless you combine them with a food that has cholesterol. Foods without cholesterol include:  Grains and cereals.  Vegetables.  Fruits.  Vegetable oils, such as olive, canola, and sunflower oil.  Legumes, such as peas, beans, and lentils.  Nuts and seeds.  Egg whites. Summary  The body needs cholesterol in small amounts, but too much cholesterol can cause damage to the  arteries and heart.  Most people should eat less than 200 milligrams (mg) of cholesterol a day. This information is not intended to replace advice given to you by your health care provider. Make sure you discuss any questions you have with your health care provider. Document Released: 06/11/2017 Document Revised: 09/27/2017 Document Reviewed: 06/11/2017 Elsevier Patient Education  2020 Elsevier Inc.  

## 2020-03-08 ENCOUNTER — Telehealth: Payer: Self-pay | Admitting: Internal Medicine

## 2020-03-08 ENCOUNTER — Other Ambulatory Visit: Payer: Self-pay

## 2020-03-08 NOTE — Telephone Encounter (Signed)
I called pt to remind him of his appt. on Thursday he stated that he has a cough due to covid last year. Is he able to come in on Thursday?

## 2020-03-08 NOTE — Telephone Encounter (Signed)
Please advise 

## 2020-03-08 NOTE — Telephone Encounter (Signed)
He did have covid 19 last year  Is having active symptoms?  We can do virtual  likely refer to pulmonary   TMS

## 2020-03-08 NOTE — Telephone Encounter (Signed)
appt was changed to virtual /ij

## 2020-03-08 NOTE — Telephone Encounter (Signed)
Noted, see previous message.

## 2020-03-08 NOTE — Telephone Encounter (Signed)
Nicholas Barber routed conversation to You 15 minutes ago (9:35 AM)  Nicholas Barber 15 minutes ago (9:35 AM)  IJ   appt was changed to virtual /ij         Documentation   Noted

## 2020-03-10 ENCOUNTER — Telehealth (INDEPENDENT_AMBULATORY_CARE_PROVIDER_SITE_OTHER): Payer: Managed Care, Other (non HMO) | Admitting: Internal Medicine

## 2020-03-10 ENCOUNTER — Encounter: Payer: Self-pay | Admitting: Internal Medicine

## 2020-03-10 VITALS — BP 120/90 | Ht 65.0 in | Wt 185.0 lb

## 2020-03-10 DIAGNOSIS — E559 Vitamin D deficiency, unspecified: Secondary | ICD-10-CM

## 2020-03-10 DIAGNOSIS — J309 Allergic rhinitis, unspecified: Secondary | ICD-10-CM

## 2020-03-10 DIAGNOSIS — Z1389 Encounter for screening for other disorder: Secondary | ICD-10-CM

## 2020-03-10 DIAGNOSIS — R05 Cough: Secondary | ICD-10-CM

## 2020-03-10 DIAGNOSIS — E785 Hyperlipidemia, unspecified: Secondary | ICD-10-CM

## 2020-03-10 DIAGNOSIS — Z1329 Encounter for screening for other suspected endocrine disorder: Secondary | ICD-10-CM

## 2020-03-10 DIAGNOSIS — J9801 Acute bronchospasm: Secondary | ICD-10-CM

## 2020-03-10 DIAGNOSIS — R7989 Other specified abnormal findings of blood chemistry: Secondary | ICD-10-CM

## 2020-03-10 DIAGNOSIS — Z0000 Encounter for general adult medical examination without abnormal findings: Secondary | ICD-10-CM

## 2020-03-10 DIAGNOSIS — R059 Cough, unspecified: Secondary | ICD-10-CM

## 2020-03-10 MED ORDER — ALBUTEROL SULFATE HFA 108 (90 BASE) MCG/ACT IN AERS: 1.0000 | INHALATION_SPRAY | RESPIRATORY_TRACT | 11 refills | Status: DC | PRN

## 2020-03-10 MED ORDER — SALINE SPRAY 0.65 % NA SOLN: 2.0000 | NASAL | 11 refills | Status: DC | PRN

## 2020-03-10 MED ORDER — LORATADINE 10 MG PO TABS: 10.0000 mg | ORAL_TABLET | Freq: Every day | ORAL | 3 refills | Status: DC | PRN

## 2020-03-10 MED ORDER — FLUTICASONE PROPIONATE 50 MCG/ACT NA SUSP: 2.0000 | Freq: Every day | NASAL | 11 refills | Status: AC

## 2020-03-10 NOTE — Progress Notes (Signed)
Telephone Note  I connected with Nicholas Barber  on 03/10/20 at  8:40 AM EDT by a telephone and verified that I am speaking with the correct person using two identifiers.  Location patient: home Location provider:work or home office Persons participating in the virtual visit: patient, provider  I discussed the limitations of evaluation and management by telemedicine and the availability of in person appointments. The patient expressed understanding and agreed to proceed.   HPI: 1. covid pna 05/06/19 s/p remdesivir and unc, former smoker, and allergy to pollen no h/o asthma still c/o residual cough and wheezing since 04/2019 but albuterol inhaler and nasal sprays help.  2. HLD changed diet and tried to exercise until recently 1.5 miles walking running  3. Elevated lfts consider US abdomen in future  ROS: See pertinent positives and negatives per HPI.  Past Medical History:  Diagnosis Date  . COVID-19 virus infection    05/06/19    Past Surgical History:  Procedure Laterality Date  . NO PAST SURGERIES      Family History  Problem Relation Age of Onset  . Cancer Maternal Grandfather        skin   . Macular degeneration Maternal Grandfather   . Early death Neg Hx   . Heart disease Neg Hx     SOCIAL HX:  DPR mom Nicholas Barber [phone] Works at [employer] at home  Baby boy Nicholas Barber due 05/2020   Current Outpatient Medications:  .  albuterol (VENTOLIN HFA) 108 (90 Base) MCG/ACT inhaler, Inhale 1-2 puffs into the lungs every 4 (four) hours as needed for wheezing or shortness of breath., Disp: 18 g, Rfl: 11 .  fluticasone (FLONASE) 50 MCG/ACT nasal spray, Place 2 sprays into both nostrils daily. Prn max 2 sprays each nose. Use after saline, Disp: 16 g, Rfl: 11 .  loratadine (CLARITIN) 10 MG tablet, Take 1 tablet (10 mg total) by mouth daily as needed for allergies., Disp: 90 tablet, Rfl: 3 .  sodium chloride (OCEAN) 0.65 % SOLN nasal spray, Place 2 sprays into both  nostrils as needed for congestion., Disp: 30 mL, Rfl: 11  EXAM:  VITALS per patient if applicable:  GENERAL: alert, oriented, appears well and in no acute distress  PSYCH/NEURO: pleasant and cooperative, no obvious depression or anxiety, speech and thought processing grossly intact  ASSESSMENT AND PLAN:  Discussed the following assessment and plan:  Cough - Plan: albuterol (VENTOLIN HFA) 108 (90 Base) MCG/ACT inhaler, Ambulatory referral to Pulmonology  Hyperlipidemia, unspecified hyperlipidemia type - Plan: Lipid panel  Bronchospasm - Plan: albuterol (VENTOLIN HFA) 108 (90 Base) MCG/ACT inhaler, Ambulatory referral to Pulmonology  Allergic rhinitis, unspecified seasonality, unspecified trigger - Plan: loratadine (CLARITIN) 10 MG tablet, fluticasone (FLONASE) 50 MCG/ACT nasal spray, sodium chloride (OCEAN) 0.65 % SOLN nasal spray, Ambulatory referral to Pulmonology  Elevated liver function tests - Plan: Comprehensive metabolic panel  Well adult exam - Plan: Comprehensive metabolic panel, CBC with Differential/Platelet  Thyroid disorder screening - Plan: TSH  Screening for blood or protein in urine - Plan: Urinalysis, Routine w reflex microscopic  Vitamin D deficiency - Plan: Vitamin D (25 hydroxy)  HM sch fasting labs at [facility] 05/2020  Flu never had   utd Tdap  HIV and hep C negative rec healthy diet and exercise  Consider [place] ab elevated lfts in future   -we discussed possible serious and likely etiologies, options for evaluation and workup, limitations of telemedicine visit vs in person visit,  treatment, treatment risks and precautions. Pt prefers to treat via telemedicine empirically rather then risking or undertaking an in person visit at this moment. Patient agrees to seek prompt in person care if worsening, new symptoms arise, or if is not improving with treatment.   I discussed the assessment and treatment plan with the patient. The patient was provided an  opportunity to ask questions and all were answered. The patient agreed with the plan and demonstrated an understanding of the instructions.   The patient was advised to call back or seek an in-person evaluation if the symptoms worsen or if the condition fails to improve as anticipated.  Time spent 20 min Delorise Jackson, MD

## 2020-05-05 ENCOUNTER — Ambulatory Visit: Payer: Managed Care, Other (non HMO) | Admitting: Pulmonary Disease

## 2020-05-05 ENCOUNTER — Encounter: Payer: Self-pay | Admitting: Pulmonary Disease

## 2020-05-05 ENCOUNTER — Other Ambulatory Visit: Payer: Self-pay

## 2020-05-05 VITALS — BP 130/78 | HR 83 | Temp 97.9°F | Ht 66.0 in | Wt 194.0 lb

## 2020-05-05 DIAGNOSIS — R05 Cough: Secondary | ICD-10-CM

## 2020-05-05 DIAGNOSIS — Z8616 Personal history of COVID-19: Secondary | ICD-10-CM

## 2020-05-05 DIAGNOSIS — R0602 Shortness of breath: Secondary | ICD-10-CM

## 2020-05-05 DIAGNOSIS — R059 Cough, unspecified: Secondary | ICD-10-CM

## 2020-05-05 NOTE — Progress Notes (Signed)
      Assessment & Plan:  1. Cough (Primary)  2. Shortness of breath - Pulmonary Function Test [facility] Only; Future - ECHOCARDIOGRAM COMPLETE; Future  3. Personal history of covid-19   Patient Instructions  We are going to get breathing tests  We will also get a heart test   We will see you in follow-up in  4 weeks time call sooner should any new difficulties arise.  Please note: late entry documentation due to logistical difficulties during COVID-19 pandemic. This note is filed for information purposes only, and is not intended to be used for billing, nor does it represent the full scope/nature of the visit in question. Please see any associated scanned media linked to date of encounter for additional pertinent information.  Subjective:    HPI: Nicholas Barber is a 29 y.o. male presenting to the pulmonology clinic on 05/05/2020 with report of: pulmonary consult (per Dr. Rolan- dx with covid 04/2019. since covid pt has had a non prod cough and wheezing. sx have improved but are still present. )     Outpatient Encounter Medications as of 05/05/2020  Medication Sig   fluticasone  (FLONASE ) 50 MCG/ACT nasal spray Place 2 sprays into both nostrils daily. Prn max 2 sprays each nose. Use after saline   [DISCONTINUED] albuterol  (VENTOLIN  HFA) 108 (90 Base) MCG/ACT inhaler Inhale 1-2 puffs into the lungs every 4 (four) hours as needed for wheezing or shortness of breath. (Patient not taking: Reported on 09/13/2021)   [DISCONTINUED] loratadine  (CLARITIN ) 10 MG tablet Take 1 tablet (10 mg total) by mouth daily as needed for allergies. (Patient not taking: Reported on 09/13/2021)   [DISCONTINUED] sodium chloride (OCEAN) 0.65 % SOLN nasal spray Place 2 sprays into both nostrils as needed for congestion. (Patient not taking: Reported on 09/13/2021)   No facility-administered encounter medications on file as of 05/05/2020.      Objective:   Vitals:   05/05/20 0940  BP: 130/78  Pulse: 83  Temp:  97.9 F (36.6 C)  Height: 5' 6 (1.676 m)  Weight: 194 lb (88 kg)  SpO2: 97%  TempSrc: Temporal  BMI (Calculated): 31.33     Physical exam documentation is limited by delayed entry of information.

## 2020-05-05 NOTE — Patient Instructions (Signed)
We are going to get breathing tests  We will also get a heart test   We will see you in follow-up in  4 weeks time call sooner should any new difficulties arise.

## 2020-05-17 ENCOUNTER — Telehealth: Payer: Self-pay

## 2020-05-17 NOTE — Telephone Encounter (Signed)
Pt is aware of date/time of covid test prior to PFT.  

## 2020-05-20 ENCOUNTER — Other Ambulatory Visit
Admission: RE | Admit: 2020-05-20 | Discharge: 2020-05-20 | Disposition: A | Discharge status: Home / Self Care | Payer: Managed Care, Other (non HMO) | Source: Ambulatory Visit | Attending: Pulmonary Disease | Admitting: Pulmonary Disease

## 2020-05-20 ENCOUNTER — Other Ambulatory Visit: Payer: Self-pay

## 2020-05-20 DIAGNOSIS — Z20822 Contact with and (suspected) exposure to covid-19: Secondary | ICD-10-CM | POA: Diagnosis not present

## 2020-05-20 DIAGNOSIS — Z01812 Encounter for preprocedural laboratory examination: Secondary | ICD-10-CM | POA: Diagnosis present

## 2020-05-20 LAB — SARS CORONAVIRUS 2 (TAT 6-24 HRS): SARS Coronavirus 2: NEGATIVE

## 2020-05-23 ENCOUNTER — Ambulatory Visit
Admission: RE | Admit: 2020-05-23 | Discharge: 2020-05-23 | Disposition: A | Discharge status: Home / Self Care | Payer: Managed Care, Other (non HMO) | Source: Ambulatory Visit | Attending: Pulmonary Disease | Admitting: Pulmonary Disease

## 2020-05-23 ENCOUNTER — Other Ambulatory Visit: Payer: Self-pay

## 2020-05-23 ENCOUNTER — Ambulatory Visit (HOSPITAL_COMMUNITY): Payer: Managed Care, Other (non HMO)

## 2020-05-23 DIAGNOSIS — R0602 Shortness of breath: Secondary | ICD-10-CM

## 2020-05-23 DIAGNOSIS — R0600 Dyspnea, unspecified: Secondary | ICD-10-CM | POA: Diagnosis present

## 2020-05-23 DIAGNOSIS — Z8616 Personal history of COVID-19: Secondary | ICD-10-CM | POA: Insufficient documentation

## 2020-05-23 LAB — PULMONARY FUNCTION TEST ARMC ONLY
DL/VA % pred: 96 %
DL/VA: 4.87 ml/min/mmHg/L
DLCO unc % pred: 79 %
DLCO unc: 23.02 ml/min/mmHg
FEF 25-75 Post: 3.76 L/sec
FEF 25-75 Pre: 3.56 L/sec
FEF2575-%Change-Post: 5 %
FEF2575-%Pred-Post: 90 %
FEF2575-%Pred-Pre: 85 %
FEV1-%Change-Post: 2 %
FEV1-%Pred-Post: 82 %
FEV1-%Pred-Pre: 80 %
FEV1-Post: 3.3 L
FEV1-Pre: 3.23 L
FEV1FVC-%Change-Post: 1 %
FEV1FVC-%Pred-Pre: 104 %
FEV6-%Change-Post: 0 %
FEV6-%Pred-Post: 79 %
FEV6-%Pred-Pre: 78 %
FEV6-Post: 3.81 L
FEV6-Pre: 3.78 L
FEV6FVC-%Pred-Post: 101 %
FEV6FVC-%Pred-Pre: 101 %
FVC-%Change-Post: 0 %
FVC-%Pred-Post: 78 %
FVC-%Pred-Pre: 78 %
FVC-Post: 3.81 L
Post FEV1/FVC ratio: 87 %
Post FEV6/FVC ratio: 100 %
Pre FEV1/FVC ratio: 86 %
Pre FEV6/FVC Ratio: 100 %
RV % pred: 62 %
RV: 0.87 L
TLC % pred: 75 %
TLC: 4.6 L

## 2020-05-23 LAB — ECHOCARDIOGRAM COMPLETE
AR max vel: 2.32 cm2
AV Area VTI: 2.76 cm2
AV Area mean vel: 2.37 cm2
AV Mean grad: 3 mmHg
AV Peak grad: 4.5 mmHg
Ao pk vel: 1.06 m/s
Area-P 1/2: 4.71 cm2
S' Lateral: 2.58 cm

## 2020-05-23 MED ORDER — ALBUTEROL SULFATE (2.5 MG/3ML) 0.083% IN NEBU
2.5000 mg | INHALATION_SOLUTION | Freq: Once | RESPIRATORY_TRACT | Status: AC
  Administered 2020-05-23: 2.5 mg via RESPIRATORY_TRACT
  Filled 2020-05-23: qty 3

## 2020-05-23 NOTE — Progress Notes (Signed)
*  PRELIMINARY RESULTS* Echocardiogram 2D Echocardiogram has been performed.  Cristela Blue 05/23/2020, 11:29 AM

## 2020-06-09 ENCOUNTER — Ambulatory Visit: Payer: Managed Care, Other (non HMO) | Admitting: Pulmonary Disease

## 2020-06-14 ENCOUNTER — Ambulatory Visit (INDEPENDENT_AMBULATORY_CARE_PROVIDER_SITE_OTHER): Payer: Managed Care, Other (non HMO) | Admitting: Pulmonary Disease

## 2020-06-14 ENCOUNTER — Encounter: Payer: Self-pay | Admitting: Pulmonary Disease

## 2020-06-14 DIAGNOSIS — R05 Cough: Secondary | ICD-10-CM

## 2020-06-14 DIAGNOSIS — R0602 Shortness of breath: Secondary | ICD-10-CM | POA: Diagnosis not present

## 2020-06-14 DIAGNOSIS — Z8616 Personal history of COVID-19: Secondary | ICD-10-CM | POA: Diagnosis not present

## 2020-06-14 DIAGNOSIS — R059 Cough, unspecified: Secondary | ICD-10-CM | POA: Insufficient documentation

## 2020-06-14 NOTE — Progress Notes (Signed)
Virtual Visit via Telephone Note  I connected with Nicholas Barber on 06/14/20 at 12:00 PM EDT by telephone and verified that I am speaking with the correct person using two identifiers.  Location: Patient: Home Provider: Office [facility] - [address] [place], [address], [place], [place] [address]   I discussed the limitations, risks, security and privacy concerns of performing an evaluation and management service by telephone and the availability of in person appointments. I also discussed with the patient that there may be a patient responsible charge related to this service. The patient expressed understanding and agreed to proceed.  Patient consented to consult via telephone: Yes People present and their role in pt care: Pt   History of Present Illness:  29 year old male former smoker followed in our office for cough and COVID-19 infection in July/2020 requiring hospitalization.  Patient was treated with remdesivir and steroids.  Did not require mechanical ventilation.  Former smoker Maintenance: None Patient of Dr. Jayme Cloud  Chief complaint: PFT follow-up  29 year old male former smoker followed in our office for cough.  He was last seen by Dr. Jayme Cloud in July/2021.  Pulmonary function test was ordered.  Those results are listed below:  05/23/2020-pulmonary function test-FVC 3.78 (78% predicted), postbronchodilator ratio 87, postbronchodilator FEV1 3.30 (82% predicted), TLC 4.6 (75% predicted) DLCO 23.02 (79% predicted)   Patient is status post COVID-19 infection.  Patient initially tested positive for Covid on 05/08/2019.  Patient required hospitalization at [facility].  An excerpt of his treatment summary is listed below:  COVID-19: Date of symptom onset: 7/4  Date and/or site of exposure (if known): Known exposure at work  SARS-COV2 PCR: positive, date: 7/10  Supplemental Oxygen: 2L  Therapy: Remdesevir / Dex    Patient never required mechanical ventilation.  He is no longer on  supplemental oxygen.  Patient is back at work.  He works in [employer].  He is recently completed an echocardiogram that showed normal LV ejection fraction as well as normal RV functioning.  Observations/Objective:  Social History   Tobacco Use  Smoking Status Former Smoker  . Packs/day: 0.75  . Years: 10.00  . Pack years: 7.50  . Types: Cigarettes  . Start date: 2010  . Quit date: 04/2019  . Years since quitting: 1.1  Smokeless Tobacco Never Used   Immunization History  Administered Date(s) Administered  . Tdap 09/20/2016      Assessment and Plan:  Personal history of covid-19 Previously diagnosed with Covid and treated with hospitalization in July/2020 Patient was treated with remdesivir and dexamethasone Patient needed supplemental oxygen Patient has not received the COVID-19 vaccine yet  Discussion: Reviewed with patient that pulmonary function testing does indicate slight restriction.  Will obtain chest imaging to further evaluate.  Patient is no longer on supplemental oxygen.  Reviewed echocardiogram pulmonary function testing with patient  Plan: Chest x-ray today We will continue to clinically monitor Emphasized importance of obtaining the COVID-19 vaccine given delta variant as well as no more antibody protection given the fact the patient is Covid infection was in July/2020 5-month follow-up with our office in [place]   Follow Up Instructions:  Return in about 3 months (around 09/14/2020), or if symptoms worsen or fail to improve, for [facility] - Dr. Jayme Cloud.   I discussed the assessment and treatment plan with the patient. The patient was provided an opportunity to ask questions and all were answered. The patient agreed with the plan and demonstrated an understanding of the instructions.  The patient was advised to call back or seek an in-person evaluation if the symptoms worsen or if the condition fails to improve as anticipated.  I  provided 22 minutes of non-face-to-face time during this encounter.   Coral Ceo, NP

## 2020-06-14 NOTE — Patient Instructions (Addendum)
You were seen today by Coral Ceo, NP  for:   1. Personal history of covid-19  - DG Chest 2 View; Future  We will obtain a chest x-ray given the fact that you are status post COVID-19 infection in July/2020  Based on the chest x-ray results could consider CT imaging to further evaluate.  We recommend that you obtain the COVID-19 vaccine soon as possible  2. Shortness of breath 3. Cough  We will continue to clinically monitor   We recommend today:  Orders Placed This Encounter  Procedures  . DG Chest 2 View    Standing Status:   Future    Standing Expiration Date:   10/14/2020    Order Specific Question:   Reason for Exam (SYMPTOM  OR DIAGNOSIS REQUIRED)    Answer:   s/p covid19 july/2020    Order Specific Question:   Preferred imaging location?    Answer:   [facility]    Order Specific Question:   Radiology Contrast Protocol - do NOT remove file path    Answer:   [email or web address]   Orders Placed This Encounter  Procedures  . DG Chest 2 View   No orders of the defined types were placed in this encounter.   Follow Up:    Return in about 3 months (around 09/14/2020), or if symptoms worsen or fail to improve, for [facility] - Dr. Jayme Cloud.   Please do your part to reduce the spread of COVID-19:      Reduce your risk of any infection  and COVID19 by using the similar precautions used for avoiding the common cold or flu:  Marland Kitchen Wash your hands often with soap and warm water for at least 20 seconds.  If soap and water are not readily available, use an alcohol-based hand sanitizer with at least 60% alcohol.  . If coughing or sneezing, cover your mouth and nose by coughing or sneezing into the elbow areas of your shirt or coat, into a tissue or into your sleeve (not your hands). Drinda Butts A MASK when in public  . Avoid shaking hands with others and consider head nods or verbal greetings only. . Avoid touching your eyes,  nose, or mouth with unwashed hands.  . Avoid close contact with people who are sick. . Avoid places or events with large numbers of people in one location, like concerts or sporting events. . If you have some symptoms but not all symptoms, continue to monitor at home and seek medical attention if your symptoms worsen. . If you are having a medical emergency, call 911.   ADDITIONAL HEALTHCARE OPTIONS FOR PATIENTS  [facility] / e-Visit: [email or web address]         [facility]: [id number]  [facility]: [phone]                   [facility]: [phone]     It is flu season:   >>> Best ways to protect herself from the flu: Receive the yearly flu vaccine, practice good hand hygiene washing with soap and also using hand sanitizer when available, eat a nutritious meals, get adequate rest, hydrate appropriately   Please contact the office if your symptoms worsen or you have concerns that you are not improving.   Thank you for choosing [facility] for your healthcare, and for allowing [place] to partner with you on your healthcare journey. I am thankful to be able to provide care  to you today.   Wyn Quaker FNP-C

## 2020-06-14 NOTE — Assessment & Plan Note (Signed)
Previously diagnosed with Covid and treated with hospitalization in July/2020 Patient was treated with remdesivir and dexamethasone Patient needed supplemental oxygen Patient has not received the COVID-19 vaccine yet  Discussion: Reviewed with patient that pulmonary function testing does indicate slight restriction.  Will obtain chest imaging to further evaluate.  Patient is no longer on supplemental oxygen.  Reviewed echocardiogram pulmonary function testing with patient  Plan: Chest x-ray today We will continue to clinically monitor Emphasized importance of obtaining the COVID-19 vaccine given delta variant as well as no more antibody protection given the fact the patient is Covid infection was in July/2020 35-month follow-up with our office in [facility]

## 2020-07-15 NOTE — Progress Notes (Signed)
Agree with the details of the visit as noted by Brian Mack, NP.  C. Laura Sacramento Monds, MD [facility] 

## 2020-07-20 NOTE — Progress Notes (Signed)
Agree with the details of the visit as noted by Brian Mack, NP.  C. Laura Harbor Paster, MD [facility] 

## 2020-09-08 ENCOUNTER — Telehealth: Payer: Self-pay | Admitting: Internal Medicine

## 2020-09-08 ENCOUNTER — Encounter: Payer: Managed Care, Other (non HMO) | Admitting: Internal Medicine

## 2020-09-08 NOTE — Telephone Encounter (Signed)
Patient no-showed today's appointment; appointment was for 11/11 at 9:00 am, provider notified for review of record. Mychart message sent for patient to re-schedule.

## 2020-09-13 ENCOUNTER — Other Ambulatory Visit: Payer: Self-pay

## 2020-09-13 ENCOUNTER — Ambulatory Visit (INDEPENDENT_AMBULATORY_CARE_PROVIDER_SITE_OTHER): Payer: Managed Care, Other (non HMO) | Admitting: Internal Medicine

## 2020-09-13 ENCOUNTER — Encounter: Payer: Self-pay | Admitting: Internal Medicine

## 2020-09-13 VITALS — BP 120/86 | HR 84 | Temp 97.9°F | Ht 66.73 in | Wt 174.2 lb

## 2020-09-13 DIAGNOSIS — M25562 Pain in left knee: Secondary | ICD-10-CM

## 2020-09-13 DIAGNOSIS — Z20822 Contact with and (suspected) exposure to covid-19: Secondary | ICD-10-CM | POA: Diagnosis not present

## 2020-09-13 DIAGNOSIS — M25552 Pain in left hip: Secondary | ICD-10-CM | POA: Diagnosis not present

## 2020-09-13 DIAGNOSIS — E663 Overweight: Secondary | ICD-10-CM

## 2020-09-13 DIAGNOSIS — G8929 Other chronic pain: Secondary | ICD-10-CM

## 2020-09-13 DIAGNOSIS — Z0000 Encounter for general adult medical examination without abnormal findings: Secondary | ICD-10-CM

## 2020-09-13 DIAGNOSIS — E559 Vitamin D deficiency, unspecified: Secondary | ICD-10-CM

## 2020-09-13 DIAGNOSIS — Z1389 Encounter for screening for other disorder: Secondary | ICD-10-CM

## 2020-09-13 DIAGNOSIS — Z1329 Encounter for screening for other suspected endocrine disorder: Secondary | ICD-10-CM | POA: Diagnosis not present

## 2020-09-13 DIAGNOSIS — E785 Hyperlipidemia, unspecified: Secondary | ICD-10-CM

## 2020-09-13 LAB — COMPREHENSIVE METABOLIC PANEL
ALT: 59 U/L — ABNORMAL HIGH (ref 0–53)
AST: 32 U/L (ref 0–37)
Albumin: 5 g/dL (ref 3.5–5.2)
Alkaline Phosphatase: 93 U/L (ref 39–117)
BUN: 20 mg/dL (ref 6–23)
CO2: 30 mEq/L (ref 19–32)
Calcium: 9.9 mg/dL (ref 8.4–10.5)
Chloride: 101 mEq/L (ref 96–112)
Creatinine, Ser: 1.1 mg/dL (ref 0.40–1.50)
GFR: 90.4 mL/min (ref >60.00)
Glucose, Bld: 96 mg/dL (ref 70–99)
Potassium: 4.1 mEq/L (ref 3.5–5.1)
Sodium: 139 mEq/L (ref 135–145)
Total Bilirubin: 0.7 mg/dL (ref 0.2–1.2)
Total Protein: 7.5 g/dL (ref 6.0–8.3)

## 2020-09-13 LAB — CBC WITH DIFFERENTIAL/PLATELET
Basophils Absolute: 0 10*3/uL (ref 0.0–0.1)
Basophils Relative: 0.4 % (ref 0.0–3.0)
Eosinophils Absolute: 0.1 10*3/uL (ref 0.0–0.7)
Eosinophils Relative: 1.5 % (ref 0.0–5.0)
HCT: 47.4 % (ref 39.0–52.0)
Hemoglobin: 15.9 g/dL (ref 13.0–17.0)
Lymphocytes Relative: 34.6 % (ref 12.0–46.0)
Lymphs Abs: 1.6 10*3/uL (ref 0.7–4.0)
MCHC: 33.6 g/dL (ref 30.0–36.0)
MCV: 92.3 fl (ref 78.0–100.0)
Monocytes Absolute: 0.4 10*3/uL (ref 0.1–1.0)
Monocytes Relative: 9 % (ref 3.0–12.0)
Neutro Abs: 2.5 10*3/uL (ref 1.4–7.7)
Neutrophils Relative %: 54.5 % (ref 43.0–77.0)
Platelets: 187 10*3/uL (ref 150.0–400.0)
RBC: 5.13 Mil/uL (ref 4.22–5.81)
RDW: 13.3 % (ref 11.5–15.5)
WBC: 4.6 10*3/uL (ref 4.0–10.5)

## 2020-09-13 LAB — LIPID PANEL
Cholesterol: 193 mg/dL (ref 0–200)
HDL: 32.2 mg/dL — ABNORMAL LOW (ref >39.00)
LDL Cholesterol: 122 mg/dL — ABNORMAL HIGH (ref 0–99)
NonHDL: 160.69
Total CHOL/HDL Ratio: 6
Triglycerides: 191 mg/dL — ABNORMAL HIGH (ref 0.0–149.0)
VLDL: 38.2 mg/dL (ref 0.0–40.0)

## 2020-09-13 LAB — TSH: TSH: 2.8 u[IU]/mL (ref 0.35–4.50)

## 2020-09-13 LAB — VITAMIN D 25 HYDROXY (VIT D DEFICIENCY, FRACTURES): VITD: 19.03 ng/mL — ABNORMAL LOW (ref 30.00–100.00)

## 2020-09-13 NOTE — Patient Instructions (Addendum)
Nicholas Barber or Nicholas Barber chiropractor   Contact HR about worker comp case for knee consider MRI left knee   Consider pataday eye drops eye allergy symptoms  Similason brand complete eye relief   Debrox ear wax drops 5-10 min, 5-10 drops x 1 week 1x per month flush with warm water and hydrogen peroxide  Hip Exercises Ask your health care provider which exercises are safe for you. Do exercises exactly as told by your health care provider and adjust them as directed. It is normal to feel mild stretching, pulling, tightness, or discomfort as you do these exercises. Stop right away if you feel sudden pain or your pain gets worse. Do not begin these exercises until told by your health care provider. Stretching and range-of-motion exercises These exercises warm up your muscles and joints and improve the movement and flexibility of your hip. These exercises also help to relieve pain, numbness, and tingling. You may be asked to limit your range of motion if you had a hip replacement. Talk to your health care provider about these restrictions. Hamstrings, supine  1. Lie on your back (supine position). 2. Loop a belt or towel over the ball of your left / right foot. The ball of your foot is on the walking surface, right under your toes. 3. Straighten your left / right knee and slowly pull on the belt or towel to raise your leg until you feel a gentle stretch behind your knee (hamstring). ? Do not let your knee bend while you do this. ? Keep your other leg flat on the floor. 4. Hold this position for __________ seconds. 5. Slowly return your leg to the starting position. Repeat __________ times. Complete this exercise __________ times a day. Hip rotation  1. Lie on your back on a firm surface. 2. With your left / right hand, gently pull your left / right knee toward the shoulder that is on the same side of the body. Stop when your knee is pointing toward the ceiling. 3. Hold your left / right ankle with your  other hand. 4. Keeping your knee steady, gently pull your left / right ankle toward your other shoulder until you feel a stretch in your buttocks. ? Keep your hips and shoulders firmly planted while you do this stretch. 5. Hold this position for __________ seconds. Repeat __________ times. Complete this exercise __________ times a day. Seated stretch This exercise is sometimes called hamstrings and adductors stretch. 1. Sit on the floor with your legs stretched wide. Keep your knees straight during this exercise. 2. Keeping your head and back in a straight line, bend at your waist to reach for your left foot (position A). You should feel a stretch in your right inner thigh (adductors). 3. Hold this position for __________ seconds. Then slowly return to the upright position. 4. Keeping your head and back in a straight line, bend at your waist to reach forward (position B). You should feel a stretch behind both of your thighs and knees (hamstrings). 5. Hold this position for __________ seconds. Then slowly return to the upright position. 6. Keeping your head and back in a straight line, bend at your waist to reach for your right foot (position C). You should feel a stretch in your left inner thigh (adductors). 7. Hold this position for __________ seconds. Then slowly return to the upright position. Repeat __________ times. Complete this exercise __________ times a day. Lunge This exercise stretches the muscles of the hip (hip flexors). 1. Place  your left / right knee on the floor and bend your other knee so that is directly over your ankle. You should be half-kneeling. 2. Keep good posture with your head over your shoulders. 3. Tighten your buttocks to point your tailbone downward. This will prevent your back from arching too much. 4. You should feel a gentle stretch in the front of your left / right thigh and hip. If you do not feel a stretch, slide your other foot forward slightly and then slowly  lunge forward with your chest up until your knee once again lines up over your ankle. ? Make sure your tailbone continues to point downward. 5. Hold this position for __________ seconds. 6. Slowly return to the starting position. Repeat __________ times. Complete this exercise __________ times a day. Strengthening exercises These exercises build strength and endurance in your hip. Endurance is the ability to use your muscles for a long time, even after they get tired. Bridge This exercise strengthens the muscles of your hip (hip extensors). 1. Lie on your back on a firm surface with your knees bent and your feet flat on the floor. 2. Tighten your buttocks muscles and lift your bottom off the floor until the trunk of your body and your hips are level with your thighs. ? Do not arch your back. ? You should feel the muscles working in your buttocks and the back of your thighs. If you do not feel these muscles, slide your feet 1-2 inches (2.5-5 cm) farther away from your buttocks. 3. Hold this position for __________ seconds. 4. Slowly lower your hips to the starting position. 5. Let your muscles relax completely between repetitions. Repeat __________ times. Complete this exercise __________ times a day. Straight leg raises, side-lying This exercise strengthens the muscles that move the hip joint away from the center of the body (hip abductors). 1. Lie on your side with your left / right leg in the top position. Lie so your head, shoulder, hip, and knee line up. You may bend your bottom knee slightly to help you balance. 2. Roll your hips slightly forward, so your hips are stacked directly over each other and your left / right knee is facing forward. 3. Leading with your heel, lift your top leg 4-6 inches (10-15 cm). You should feel the muscles in your top hip lifting. ? Do not let your foot drift forward. ? Do not let your knee roll toward the ceiling. 4. Hold this position for __________  seconds. 5. Slowly return to the starting position. 6. Let your muscles relax completely between repetitions. Repeat __________ times. Complete this exercise __________ times a day. Straight leg raises, side-lying This exercise strengthens the muscles that move the hip joint toward the center of the body (hip adductors). 1. Lie on your side with your left / right leg in the bottom position. Lie so your head, shoulder, hip, and knee line up. You may place your upper foot in front to help you balance. 2. Roll your hips slightly forward, so your hips are stacked directly over each other and your left / right knee is facing forward. 3. Tense the muscles in your inner thigh and lift your bottom leg 4-6 inches (10-15 cm). 4. Hold this position for __________ seconds. 5. Slowly return to the starting position. 6. Let your muscles relax completely between repetitions. Repeat __________ times. Complete this exercise __________ times a day. Straight leg raises, supine This exercise strengthens the muscles in the front of your thigh (  quadriceps). 1. Lie on your back (supine position) with your left / right leg extended and your other knee bent. 2. Tense the muscles in the front of your left / right thigh. You should see your kneecap slide up or see increased dimpling just above your knee. 3. Keep these muscles tight as you raise your leg 4-6 inches (10-15 cm) off the floor. Do not let your knee bend. 4. Hold this position for __________ seconds. 5. Keep these muscles tense as you lower your leg. 6. Relax the muscles slowly and completely between repetitions. Repeat __________ times. Complete this exercise __________ times a day. Hip abductors, standing This exercise strengthens the muscles that move the leg and hip joint away from the center of the body (hip abductors). 1. Tie one end of a rubber exercise band or tubing to a secure surface, such as a chair, table, or pole. 2. Loop the other end of the  band or tubing around your left / right ankle. 3. Keeping your ankle with the band or tubing directly opposite the secured end, step away until there is tension in the tubing or band. Hold on to a chair, table, or pole as needed for balance. 4. Lift your left / right leg out to your side. While you do this: ? Keep your back upright. ? Keep your shoulders over your hips. ? Keep your toes pointing forward. ? Make sure to use your hip muscles to slowly lift your leg. Do not tip your body or forcefully lift your leg. 5. Hold this position for __________ seconds. 6. Slowly return to the starting position. Repeat __________ times. Complete this exercise __________ times a day. Squats This exercise strengthens the muscles in the front of your thigh (quadriceps). 1. Stand in a door frame so your feet and knees are in line with the frame. You may place your hands on the frame for balance. 2. Slowly bend your knees and lower your hips like you are going to sit in a chair. ? Keep your lower legs in a straight-up-and-down position. ? Do not let your hips go lower than your knees. ? Do not bend your knees lower than told by your health care provider. ? If your hip pain increases, do not bend as low. 3. Hold this position for ___________ seconds. 4. Slowly push with your legs to return to standing. Do not use your hands to pull yourself to standing. Repeat __________ times. Complete this exercise __________ times a day. This information is not intended to replace advice given to you by your health care provider. Make sure you discuss any questions you have with your health care provider. Document Revised: 05/21/2019 Document Reviewed: 08/26/2018 Elsevier Patient Education  2020 [employer].
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
## 2020-09-14 LAB — URINALYSIS, ROUTINE W REFLEX MICROSCOPIC
Bacteria, UA: NONE SEEN /HPF
Bilirubin Urine: NEGATIVE
Glucose, UA: NEGATIVE
Hgb urine dipstick: NEGATIVE
Hyaline Cast: NONE SEEN /LPF
Leukocytes,Ua: NEGATIVE
Nitrite: NEGATIVE
RBC / HPF: NONE SEEN /HPF (ref 0–2)
Specific Gravity, Urine: 1.031 (ref 1.001–1.03)
Squamous Epithelial / HPF: NONE SEEN /HPF (ref <=5)
WBC, UA: NONE SEEN /HPF (ref 0–5)
pH: 5.5 (ref 5.0–8.0)

## 2020-09-17 LAB — SARS-COV-2 SEMI-QUANTITATIVE TOTAL ANTIBODY, SPIKE: SARS COV2 AB, Total Spike Semi QN: >2500 U/mL — ABNORMAL HIGH (ref <0.8)

## 2020-09-19 ENCOUNTER — Other Ambulatory Visit: Payer: Self-pay | Admitting: Internal Medicine

## 2020-09-19 ENCOUNTER — Encounter: Payer: Self-pay | Admitting: Internal Medicine

## 2020-09-19 DIAGNOSIS — E559 Vitamin D deficiency, unspecified: Secondary | ICD-10-CM

## 2020-09-19 MED ORDER — CHOLECALCIFEROL 1.25 MG (50000 UT) PO CAPS: 50000.0000 [IU] | ORAL_CAPSULE | ORAL | 1 refills | Status: DC

## 2020-09-19 NOTE — Progress Notes (Signed)
Vit  

## 2021-09-13 ENCOUNTER — Other Ambulatory Visit: Payer: Self-pay

## 2021-09-13 ENCOUNTER — Encounter: Payer: Self-pay | Admitting: Internal Medicine

## 2021-09-13 ENCOUNTER — Ambulatory Visit (INDEPENDENT_AMBULATORY_CARE_PROVIDER_SITE_OTHER): Payer: Managed Care, Other (non HMO) | Admitting: Internal Medicine

## 2021-09-13 ENCOUNTER — Telehealth: Payer: Self-pay

## 2021-09-13 VITALS — BP 118/84 | HR 68 | Temp 97.9°F | Ht 66.81 in | Wt 183.8 lb

## 2021-09-13 DIAGNOSIS — R748 Abnormal levels of other serum enzymes: Secondary | ICD-10-CM

## 2021-09-13 DIAGNOSIS — Z0000 Encounter for general adult medical examination without abnormal findings: Secondary | ICD-10-CM

## 2021-09-13 DIAGNOSIS — E559 Vitamin D deficiency, unspecified: Secondary | ICD-10-CM

## 2021-09-13 DIAGNOSIS — E785 Hyperlipidemia, unspecified: Secondary | ICD-10-CM | POA: Diagnosis not present

## 2021-09-13 DIAGNOSIS — Z1329 Encounter for screening for other suspected endocrine disorder: Secondary | ICD-10-CM

## 2021-09-13 DIAGNOSIS — Z1389 Encounter for screening for other disorder: Secondary | ICD-10-CM

## 2021-09-13 DIAGNOSIS — E663 Overweight: Secondary | ICD-10-CM

## 2021-09-13 LAB — COMPREHENSIVE METABOLIC PANEL
ALT: 68 U/L — ABNORMAL HIGH (ref 0–53)
AST: 41 U/L — ABNORMAL HIGH (ref 0–37)
Albumin: 5.1 g/dL (ref 3.5–5.2)
Alkaline Phosphatase: 78 U/L (ref 39–117)
BUN: 21 mg/dL (ref 6–23)
CO2: 29 mEq/L (ref 19–32)
Calcium: 9.8 mg/dL (ref 8.4–10.5)
Chloride: 102 mEq/L (ref 96–112)
Creatinine, Ser: 1.01 mg/dL (ref 0.40–1.50)
GFR: 99.45 mL/min (ref >60.00)
Glucose, Bld: 94 mg/dL (ref 70–99)
Potassium: 4.3 mEq/L (ref 3.5–5.1)
Sodium: 138 mEq/L (ref 135–145)
Total Bilirubin: 0.7 mg/dL (ref 0.2–1.2)
Total Protein: 7.6 g/dL (ref 6.0–8.3)

## 2021-09-13 LAB — CBC WITH DIFFERENTIAL/PLATELET
Basophils Absolute: 0 10*3/uL (ref 0.0–0.1)
Basophils Relative: 0.3 % (ref 0.0–3.0)
Eosinophils Absolute: 0.1 10*3/uL (ref 0.0–0.7)
Eosinophils Relative: 1.5 % (ref 0.0–5.0)
HCT: 46.4 % (ref 39.0–52.0)
Hemoglobin: 15.7 g/dL (ref 13.0–17.0)
Lymphocytes Relative: 34.5 % (ref 12.0–46.0)
Lymphs Abs: 1.7 10*3/uL (ref 0.7–4.0)
MCHC: 33.8 g/dL (ref 30.0–36.0)
MCV: 92.8 fl (ref 78.0–100.0)
Monocytes Absolute: 0.4 10*3/uL (ref 0.1–1.0)
Monocytes Relative: 7.9 % (ref 3.0–12.0)
Neutro Abs: 2.8 10*3/uL (ref 1.4–7.7)
Neutrophils Relative %: 55.8 % (ref 43.0–77.0)
Platelets: 230 10*3/uL (ref 150.0–400.0)
RBC: 5 Mil/uL (ref 4.22–5.81)
RDW: 13.3 % (ref 11.5–15.5)
WBC: 5.1 10*3/uL (ref 4.0–10.5)

## 2021-09-13 LAB — LIPID PANEL
Cholesterol: 256 mg/dL — ABNORMAL HIGH (ref 0–200)
HDL: 42.2 mg/dL (ref >39.00)
NonHDL: 213.54
Total CHOL/HDL Ratio: 6
Triglycerides: 218 mg/dL — ABNORMAL HIGH (ref 0.0–149.0)
VLDL: 43.6 mg/dL — ABNORMAL HIGH (ref 0.0–40.0)

## 2021-09-13 LAB — LDL CHOLESTEROL, DIRECT: Direct LDL: 164 mg/dL

## 2021-09-13 LAB — VITAMIN D 25 HYDROXY (VIT D DEFICIENCY, FRACTURES): VITD: 23.34 ng/mL — ABNORMAL LOW (ref 30.00–100.00)

## 2021-09-13 LAB — TSH: TSH: 1.72 u[IU]/mL (ref 0.35–5.50)

## 2021-09-13 NOTE — Progress Notes (Signed)
Chief Complaint  Patient presents with   Annual Exam   Annual  1. Overweight wt was down to 158 but wt back up  2. Elevated lfts and cholesterol check today though not fasting    Review of Systems  Constitutional:  Negative for weight loss.  HENT:  Negative for hearing loss.   Eyes:  Negative for blurred vision.  Respiratory:  Negative for shortness of breath.   Cardiovascular:  Negative for chest pain.  Gastrointestinal:  Negative for abdominal pain and blood in stool.  Genitourinary:  Negative for dysuria.  Musculoskeletal:  Negative for back pain, falls and joint pain.  Skin:  Negative for rash.  Neurological:  Negative for headaches.  Psychiatric/Behavioral:  Negative for depression.   Past Medical History:  Diagnosis Date   COVID-19 virus infection    05/06/19 with pneumonia [facility], summer 2022   Past Surgical History:  Procedure Laterality Date   NO PAST SURGERIES     Family History  Problem Relation Age of Onset   Cancer Maternal Grandfather        skin    Macular degeneration Maternal Grandfather    Early death Neg Hx    Heart disease Neg Hx    Social History   Socioeconomic History   Marital status: Married    Spouse name: Not on file   Number of children: Not on file   Years of education: Not on file   Highest education level: Not on file  Occupational History   Not on file  Tobacco Use   Smoking status: Former    Packs/day: 0.75    Years: 10.00    Pack years: 7.50    Types: Cigarettes    Start date: 2010    Quit date: 04/2019    Years since quitting: 2.3   Smokeless tobacco: Never  Vaping Use   Vaping Use: Never used  Substance and Sexual Activity   Alcohol use: Yes    Alcohol/week: 2.0 standard drinks    Types: 2 Cans of beer per week   Drug use: No   Sexual activity: Yes  Other Topics Concern   Not on file  Social History Narrative   DPR mom [name] [phone]   Works at [employer]    Lives at home    Baby  boy [name] due 05/26/20 14 months   Married 07/2019 Alyssa    Social Determinants of Health   Financial Resource Strain: Not on file  Food Insecurity: Not on file  Transportation Needs: Not on file  Physical Activity: Not on file  Stress: Not on file  Social Connections: Not on file  Intimate Partner Violence: Not on file   Current Meds  Medication Sig   fluticasone (FLONASE) 50 MCG/ACT nasal spray Place 2 sprays into both nostrils daily. Prn max 2 sprays each nose. Use after saline   No Known Allergies No results found for this or any previous visit (from the past 2160 hour(s)). Objective  Body mass index is 28.95 kg/m. Wt Readings from Last 3 Encounters:  09/13/21 183 lb 12.8 oz (83.4 kg)  09/13/20 174 lb 3.2 oz (79 kg)  05/05/20 194 lb (88 kg)   Temp Readings from Last 3 Encounters:  09/13/21 97.9 F (36.6 C) (Temporal)  09/13/20 97.9 F (36.6 C) (Oral)  05/05/20 97.9 F (36.6 C) (Temporal)   BP Readings from Last 3 Encounters:  09/13/21 118/84  09/13/20 120/86  05/05/20 130/78   Pulse  Readings from Last 3 Encounters:  09/13/21 68  09/13/20 84  05/05/20 83    Physical Exam Vitals and nursing note reviewed.  Constitutional:      Appearance: Normal appearance. He is well-developed and well-groomed. He is obese.  HENT:     Head: Normocephalic and atraumatic.  Eyes:     Conjunctiva/sclera: Conjunctivae normal.     Pupils: Pupils are equal, round, and reactive to light.  Cardiovascular:     Rate and Rhythm: Normal rate and regular rhythm.     Heart sounds: Normal heart sounds.  Pulmonary:     Effort: Pulmonary effort is normal. No respiratory distress.     Breath sounds: Normal breath sounds.  Abdominal:     Tenderness: There is no abdominal tenderness.  Musculoskeletal:     Lumbar back: Tenderness present. Negative right straight leg raise test and negative left straight leg raise test.  Skin:    General: Skin is warm and moist.  Neurological:      General: No focal deficit present.     Mental Status: He is alert and oriented to person, place, and time. Mental status is at baseline.     Sensory: Sensation is intact.     Motor: Motor function is intact.     Coordination: Coordination is intact.     Gait: Gait is intact. Gait normal.  Psychiatric:        Attention and Perception: Attention and perception normal.        Mood and Affect: Mood and affect normal.        Speech: Speech normal.        Behavior: Behavior normal. Behavior is cooperative.        Thought Content: Thought content normal.        Cognition and Memory: Cognition and memory normal.        Judgment: Judgment normal.    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 French Ana McLean-Scocuzza-Internal Medicine

## 2021-09-13 NOTE — Telephone Encounter (Signed)
-----   Message from Bevelyn Buckles, MD sent at 09/13/2021  3:03 PM EST ----- Elevated liver enzymes  -is he agreeable to US abdomen? Cholesterol uncontrolled  -rec healthy diet and exercise -mail cholesterol handout  -does he want to see cardiology to f/u  Thyroid lab normal Vitamin D low -agreeable to weekly D3 x 6 months 1x per weeks Blood cts normal Urine pending

## 2021-09-13 NOTE — Patient Instructions (Signed)
Vitamin D Deficiency Vitamin D deficiency is when your body does not have enough vitamin D. Vitamin D is important to your body for many reasons: It helps the body absorb two important minerals--calcium and phosphorus. It plays a role in bone health. It may help to prevent some diseases, such as diabetes and multiple sclerosis. It plays a role in muscle function, including heart function. If vitamin D deficiency is severe, it can cause a condition in which your bones become soft. In adults, this condition is called osteomalacia. In children, this condition is called rickets. What are the causes? This condition may be caused by: Not eating enough foods that contain vitamin D. Not getting enough natural sun exposure. Having certain digestive system diseases that make it difficult for your body to absorb vitamin D. These diseases include Crohn's disease, chronic pancreatitis, and cystic fibrosis. Having a surgery in which a part of the stomach or a part of the small intestine is removed. Having chronic kidney disease or liver disease. What increases the risk? You are more likely to develop this condition if you: Are older. Do not spend much time outdoors. Live in a long-term care facility. Have had broken bones. Have weak or thin bones (osteoporosis). Have a disease or condition that changes how the body absorbs vitamin D. Have dark skin. Take certain medicines, such as steroid medicines or certain seizure medicines. Are overweight or obese. What are the signs or symptoms? In mild cases of vitamin D deficiency, there may not be any symptoms. If the condition is severe, symptoms may include: Bone pain. Muscle pain. Falling often. Broken bones caused by a minor injury. How is this diagnosed? This condition may be diagnosed with blood tests. Imaging tests such as X-rays may also be done to look for changes in the bone. How is this treated? Treatment for this condition may depend on what  caused the condition. Treatment options include: Taking vitamin D supplements. Your health care provider will suggest what dose is best for you. Taking a calcium supplement. Your health care provider will suggest what dose is best for you. Follow these instructions at home: Eating and drinking  Eat foods that contain vitamin D. Choices include: Fortified dairy products, cereals, or juices. Fortified means that vitamin D has been added to the food. Check the label on the package to see if the food is fortified. Fatty fish, such as salmon or trout. Eggs. Oysters. Mushrooms. The items listed above may not be a complete list of recommended foods and beverages. Contact a dietitian for more information. General instructions Take medicines and supplements only as told by your health care provider. Get regular, safe exposure to natural sunlight. Do not use a tanning bed. Maintain a healthy weight. Lose weight if needed. Keep all follow-up visits as told by your health care provider. This is important. How is this prevented? You can get vitamin D by: Eating foods that naturally contain vitamin D. Eating or drinking products that have been fortified with vitamin D, such as cereals, juices, and dairy products (including milk). Taking a vitamin D supplement or a multivitamin supplement that contains vitamin D. Being in the sun. Your body naturally makes vitamin D when your skin is exposed to sunlight. Your body changes the sunlight into a form of the vitamin that it can use. Contact a health care provider if: Your symptoms do not go away. You feel nauseous or you vomit. You have fewer bowel movements than usual or are constipated. Summary Vitamin  D deficiency is when your body does not have enough vitamin D. Vitamin D is important to your body for good bone health and muscle function, and it may help prevent some diseases. Vitamin D deficiency is primarily treated through supplementation. Your  health care provider will suggest what dose is best for you. You can get vitamin D by eating foods that contain vitamin D, by being in the sun, and by taking a vitamin D supplement or a multivitamin supplement that contains vitamin D. This information is not intended to replace advice given to you by your health care provider. Make sure you discuss any questions you have with your health care provider. Document Revised: 06/23/2018 Document Reviewed: 06/23/2018 Elsevier Patient Education  2022 [place].  High Cholesterol High cholesterol is a condition in which the blood has high levels of a white, waxy substance similar to fat (cholesterol). The liver makes all the cholesterol that the body needs. The human body needs small amounts of cholesterol to help build cells. A person gets extra or excess cholesterol from the food that he or she eats. The blood carries cholesterol from the liver to the rest of the body. If you have high cholesterol, deposits (plaques) may build up on the walls of your arteries. Arteries are the blood vessels that carry blood away from your heart. These plaques make the arteries narrow and stiff. Cholesterol plaques increase your risk for heart attack and stroke. Work with your health care provider to keep your cholesterol levels in a healthy range. What increases the risk? The following factors may make you more likely to develop this condition: Eating foods that are high in animal fat (saturated fat) or cholesterol. Being overweight. Not getting enough exercise. A family history of high cholesterol (familial hypercholesterolemia). Use of tobacco products. Having diabetes. What are the signs or symptoms? In most cases, high cholesterol does not usually cause any symptoms. In severe cases, very high cholesterol levels can cause: Fatty bumps under the skin (xanthomas). A white or gray ring around the black center (pupil) of the eye. How is this diagnosed? This  condition may be diagnosed based on the results of a blood test. If you are older than 31 years of age, your health care provider may check your cholesterol levels every 4-6 years. You may be checked more often if you have high cholesterol or other risk factors for heart disease. The blood test for cholesterol measures: "Bad" cholesterol, or LDL cholesterol. This is the main type of cholesterol that causes heart disease. The desired level is less than 100 mg/dL (2.59 mmol/L). "Good" cholesterol, or HDL cholesterol. HDL helps protect against heart disease by cleaning the arteries and carrying the LDL to the liver for processing. The desired level for HDL is 60 mg/dL (1.55 mmol/L) or higher. Triglycerides. These are fats that your body can store or burn for energy. The desired level is less than 150 mg/dL (1.69 mmol/L). Total cholesterol. This measures the total amount of cholesterol in your blood and includes LDL, HDL, and triglycerides. The desired level is less than 200 mg/dL (5.17 mmol/L). How is this treated? Treatment for high cholesterol starts with lifestyle changes, such as diet and exercise. Diet changes. You may be asked to eat foods that have more fiber and less saturated fats or added sugar. Lifestyle changes. These may include regular exercise, maintaining a healthy weight, and quitting use of tobacco products. Medicines. These are given when diet and lifestyle changes have not worked. You may  be prescribed a statin medicine to help lower your cholesterol levels. Follow these instructions at home: Eating and drinking  Eat a healthy, balanced diet. This diet includes: Daily servings of a variety of fresh, frozen, or canned fruits and vegetables. Daily servings of whole grain foods that are rich in fiber. Foods that are low in saturated fats and trans fats. These include poultry and fish without skin, lean cuts of meat, and low-fat dairy products. A variety of fish, especially oily fish  that contain omega-3 fatty acids. Aim to eat fish at least 2 times a week. Avoid foods and drinks that have added sugar. Use healthy cooking methods, such as roasting, grilling, broiling, baking, poaching, steaming, and stir-frying. Do not fry your food except for stir-frying. If you drink alcohol: Limit how much you have to: 0-1 drink a day for women who are not pregnant. 0-2 drinks a day for men. Know how much alcohol is in a drink. In the [place] one drink equals one 12 oz bottle of beer (355 mL), one 5 oz glass of wine (148 mL), or one 1 oz glass of hard liquor (44 mL). Lifestyle  Get regular exercise. Aim to exercise for a total of 150 minutes a week. Increase your activity level by doing activities such as gardening, walking, and taking the stairs. Do not use any products that contain nicotine or tobacco. These products include cigarettes, chewing tobacco, and vaping devices, such as e-cigarettes. If you need help quitting, ask your health care provider. General instructions Take over-the-counter and prescription medicines only as told by your health care provider. Keep all follow-up visits. This is important. Where to find more information American Heart Association: www.heart.org National Heart, Lung, and Blood Institute: [employer].is Contact a health care provider if: You have trouble achieving or maintaining a healthy diet or weight. You are starting an exercise program. You are unable to stop smoking. Get help right away if: You have chest pain. You have trouble breathing. You have discomfort or pain in your jaw, neck, back, shoulder, or arm. You have any symptoms of a stroke. "BE FAST" is an easy way to remember the main warning signs of a stroke: B - Balance. Signs are dizziness, sudden trouble walking, or loss of balance. E - Eyes. Signs are trouble seeing or a sudden change in vision. F - Face. Signs are sudden weakness or numbness of the face, or the face or eyelid  drooping on one side. A - Arms. Signs are weakness or numbness in an arm. This happens suddenly and usually on one side of the body. S - Speech. Signs are sudden trouble speaking, slurred speech, or trouble understanding what people say. T - Time. Time to call emergency services. Write down what time symptoms started. You have other signs of a stroke, such as: A sudden, severe headache with no known cause. Nausea or vomiting. Seizure. These symptoms may represent a serious problem that is an emergency. Do not wait to see if the symptoms will go away. Get medical help right away. Call your local emergency services (911 in the [place] Do not drive yourself to the hospital. Summary Cholesterol plaques increase your risk for heart attack and stroke. Work with your health care provider to keep your cholesterol levels in a healthy range. Eat a healthy, balanced diet, get regular exercise, and maintain a healthy weight. Do not use any products that contain nicotine or tobacco. These products include cigarettes, chewing tobacco, and vaping devices, such as e-cigarettes. Get  help right away if you have any symptoms of a stroke. This information is not intended to replace advice given to you by your health care provider. Make sure you discuss any questions you have with your health care provider. Document Revised: 12/29/2020 Document Reviewed: 12/19/2020 Elsevier Patient Education  [place].  Cholesterol Content in Foods Cholesterol is a waxy, fat-like substance that helps to carry fat in the blood. The body needs cholesterol in small amounts, but too much cholesterol can cause damage to the arteries and heart. What foods have cholesterol? Cholesterol is found in animal-based foods, such as meat, seafood, and dairy. Generally, low-fat dairy and lean meats have less cholesterol than full-fat dairy and fatty meats. The milligrams of cholesterol per serving (mg per serving) of common  cholesterol-containing foods are listed below. Meats and other proteins Egg -- one large whole egg has 186 mg. Veal shank -- 4 oz (113 g) has 141 mg. Lean ground [place] (93% lean) -- 4 oz (113 g) has 118 mg. Fat-trimmed lamb loin -- 4 oz (113 g) has 106 mg. Lean ground beef (90% lean) -- 4 oz (113 g) has 100 mg. Lobster -- 3.5 oz (99 g) has 90 mg. Pork loin chops -- 4 oz (113 g) has 86 mg. Canned salmon -- 3.5 oz (99 g) has 83 mg. Fat-trimmed beef top loin -- 4 oz (113 g) has 78 mg. Frankfurter -- 1 frank (3.5 oz or 99 g) has 77 mg. Crab -- 3.5 oz (99 g) has 71 mg. Roasted chicken without skin, white meat -- 4 oz (113 g) has 66 mg. Light bologna -- 2 oz (57 g) has 45 mg. Deli-cut [place] -- 2 oz (57 g) has 31 mg. Canned tuna -- 3.5 oz (99 g) has 31 mg. Berniece Salines -- 1 oz (28 g) has 29 mg. Oysters and mussels (raw) -- 3.5 oz (99 g) has 25 mg. Mackerel -- 1 oz (28 g) has 22 mg. Trout -- 1 oz (28 g) has 20 mg. Pork sausage -- 1 link (1 oz or 28 g) has 17 mg. Salmon -- 1 oz (28 g) has 16 mg. Tilapia -- 1 oz (28 g) has 14 mg. Dairy Soft-serve ice cream --  cup (4 oz or 86 g) has 103 mg. Whole-milk yogurt -- 1 cup (8 oz or 245 g) has 29 mg. Cheddar cheese -- 1 oz (28 g) has 28 mg. American cheese -- 1 oz (28 g) has 28 mg. Whole milk -- 1 cup (8 oz or 250 mL) has 23 mg. 2% milk -- 1 cup (8 oz or 250 mL) has 18 mg. Cream cheese -- 1 tablespoon (Tbsp) (14.5 g) has 15 mg. Cottage cheese --  cup (4 oz or 113 g) has 14 mg. Low-fat (1%) milk -- 1 cup (8 oz or 250 mL) has 10 mg. Sour cream -- 1 Tbsp (12 g) has 8.5 mg. Low-fat yogurt -- 1 cup (8 oz or 245 g) has 8 mg. Nonfat Greek yogurt -- 1 cup (8 oz or 228 g) has 7 mg. Half-and-half cream -- 1 Tbsp (15 mL) has 5 mg. Fats and oils Cod liver oil -- 1 tablespoon (Tbsp) (13.6 g) has 82 mg. Butter -- 1 Tbsp (14 g) has 15 mg. Lard -- 1 Tbsp (12.8 g) has 14 mg. Bacon grease -- 1 Tbsp (12.9 g) has 14 mg. Mayonnaise -- 1 Tbsp (13.8 g) has 5-10  mg. Margarine -- 1 Tbsp (14 g) has 3-10 mg. The items listed  above may not be a complete list of foods with cholesterol. Exact amounts of cholesterol in these foods may vary depending on specific ingredients and brands. Contact a dietitian for more information. What foods do not have cholesterol? Most plant-based foods do not have cholesterol unless you combine them with a food that has cholesterol. Foods without cholesterol include: Grains and cereals. Vegetables. Fruits. Vegetable oils, such as olive, canola, and sunflower oil. Legumes, such as peas, beans, and lentils. Nuts and seeds. Egg whites. The items listed above may not be a complete list of foods that do not have cholesterol. Contact a dietitian for more information. Summary The body needs cholesterol in small amounts, but too much cholesterol can cause damage to the arteries and heart. Cholesterol is found in animal-based foods, such as meat, seafood, and dairy. Generally, low-fat dairy and lean meats have less cholesterol than full-fat dairy and fatty meats. This information is not intended to replace advice given to you by your health care provider. Make sure you discuss any questions you have with your health care provider. Document Revised: 02/24/2021 Document Reviewed: 02/24/2021 Elsevier Patient Education  [place].

## 2021-09-14 LAB — URINALYSIS, ROUTINE W REFLEX MICROSCOPIC
Bilirubin Urine: NEGATIVE
Glucose, UA: NEGATIVE
Hgb urine dipstick: NEGATIVE
Ketones, ur: NEGATIVE
Leukocytes,Ua: NEGATIVE
Nitrite: NEGATIVE
Protein, ur: NEGATIVE
Specific Gravity, Urine: 1.009 (ref 1.001–1.035)
pH: 5.5 (ref 5.0–8.0)

## 2022-09-14 ENCOUNTER — Encounter: Payer: Managed Care, Other (non HMO) | Admitting: Internal Medicine
